# Patient Record
Sex: Female | Born: 1985
Health system: Western US, Academic
[De-identification: ages and names within clinical notes are randomized; demographics above are authoritative.]

## PROBLEM LIST (undated history)

## (undated) ENCOUNTER — Inpatient Hospital Stay (HOSPITAL_COMMUNITY): Admission: AD | Payer: Self-pay

## (undated) DIAGNOSIS — Z9889 Other specified postprocedural states: Secondary | ICD-10-CM

## (undated) DIAGNOSIS — B977 Papillomavirus as the cause of diseases classified elsewhere: Secondary | ICD-10-CM

## (undated) DIAGNOSIS — J302 Other seasonal allergic rhinitis: Secondary | ICD-10-CM

## (undated) DIAGNOSIS — Z8619 Personal history of other infectious and parasitic diseases: Secondary | ICD-10-CM

## (undated) DIAGNOSIS — L509 Urticaria, unspecified: Secondary | ICD-10-CM

## (undated) HISTORY — PX: NOSE SURGERY: SHX723

## (undated) HISTORY — DX: Urticaria, unspecified: L50.9

## (undated) HISTORY — DX: Papillomavirus as the cause of diseases classified elsewhere: B97.7

## (undated) HISTORY — DX: Personal history of other infectious and parasitic diseases: Z86.19

## (undated) HISTORY — DX: Other seasonal allergic rhinitis: J30.2

## (undated) HISTORY — DX: Other specified postprocedural states: Z98.890

---

## 2013-02-06 HISTORY — PX: OTHER PROCEDURE: U1053

## 2014-11-07 DIAGNOSIS — B977 Papillomavirus as the cause of diseases classified elsewhere: Secondary | ICD-10-CM

## 2014-11-07 HISTORY — DX: Papillomavirus as the cause of diseases classified elsewhere: B97.7

## 2015-07-01 ENCOUNTER — Ambulatory Visit
Admission: AD | Admit: 2015-07-01 | Discharge: 2015-07-01 | Disposition: A | Discharge status: Home / Self Care | Payer: Medicaid Other | Attending: Obstetrics & Gynecology | Admitting: Obstetrics & Gynecology

## 2015-07-01 ENCOUNTER — Encounter (HOSPITAL_BASED_OUTPATIENT_CLINIC_OR_DEPARTMENT_OTHER): Payer: Self-pay | Admitting: Gynecologic Oncology

## 2015-07-01 ENCOUNTER — Telehealth (HOSPITAL_BASED_OUTPATIENT_CLINIC_OR_DEPARTMENT_OTHER): Payer: Self-pay

## 2015-07-01 DIAGNOSIS — R109 Unspecified abdominal pain: Secondary | ICD-10-CM | POA: Insufficient documentation

## 2015-07-01 DIAGNOSIS — R197 Diarrhea, unspecified: Secondary | ICD-10-CM

## 2015-07-01 DIAGNOSIS — O26899 Other specified pregnancy related conditions, unspecified trimester: Principal | ICD-10-CM | POA: Insufficient documentation

## 2015-07-01 DIAGNOSIS — Z789 Other specified health status: Secondary | ICD-10-CM | POA: Insufficient documentation

## 2015-07-01 LAB — URINALYSIS WITH CULTURE REFLEX, WHEN INDICATED
Bilirubin: NEGATIVE
Blood: NEGATIVE
Glucose: NEGATIVE
Leuk Esterase: NEGATIVE
Nitrite: NEGATIVE
Protein: NEGATIVE
Specific Gravity: 1.017 (ref 1.002–1.030)
Urobilinogen: NEGATIVE
pH: 6 (ref 5.0–8.0)

## 2015-07-01 NOTE — Interdisciplinary (Signed)
Dr. Alphonzo Lemmingshebert at bedside, reviewed plan of care for patient to make appt for prenatal care.  MD to f/u on urinalysis culture.  All questions answered.

## 2015-07-01 NOTE — Interdisciplinary (Signed)
Lauren Booth is a 30 year old No obstetric history on file. at Unknown presenting to labor & delivery complaining of diarrhea since yesterday. Patient was seen at [facility] today and was told by her MD to come to [place] to be evaluated. Will obtain patients medical records from clinic.     Medical / OB History:   There is no problem list on file for this patient.      Chief Compliant:  No chief complaint on file.      Uterine Contractions: None    Rupture of Membrane: no    Vaginal Bleeding: no    Fetal Movement:Active    Previous C/S: no    Pre-eclampsia symptoms: no    Urinary tract infection symptoms: no      External fetal monitor and toco applied. VS obtained. Side rails up x2. Call light in reach. Patient awaiting evaluation by provider.    Dellis FilbertJolynn Marica Trentham, RN, Charity fundraiserN.

## 2015-07-01 NOTE — Telephone Encounter (Addendum)
Sofia Calling from [facility] to check if medical records were received? She is requesting to please call Pt to book New OB visit and also states Pt is pregnant and has history of cone Bx. And has a small cervix, and needs a cervical cerclage.    Best number to be reached at [phone] .Ok to leave a confidential detailed message. Please advise

## 2015-07-01 NOTE — Discharge Instructions (Signed)
Obstetrical Outpatient Discharge Instructions:    Labor & Delivery's Phone #: [phone]                             Based on the medical evaluation completed by our staff, it has been determined that you are NOT IN NEED OF EMERGENCY OBSTETRICAL SERVICES AT THIS TIME.     If and when any of the symptoms noted below occur, you are advised to go to the hospital closest to your home that provides obstetrical services or to the hospital that you and your health care provider have agreed upon.    Please follow the instructions for:    PRETERM LABOR PRECAUTIONS    1. Regular uterine tightening  2. Low, dull backache.  3. Menstrual cramps  4. Pressure in your pelvis  5. Leaking or gushing fluid from your vagina.  6. Changes in vaginal discharge; watery, bloody or mucousy.          PREGNANCY INDUCED HYPERTENSION    1. Headache: sudden or severe.  2. Visual problems: blurred or double vision or "seeing spots"  3. Pain under your ribs, right over your stomach  4. Swelling of your face and hands  5. Swelling of your ankles or feet after 12-hour rest  6. Decrease in the amount of your urine  7. Rapid weight gain: 4 or 5 pounds in one week.

## 2015-07-01 NOTE — Progress Notes (Signed)
ATTENDING OB TRIAGE NOTE:    Subjective    Chief complaint:  Diarrhea and cramps    History of present illness:  Briefly, this a 30 year old G1P0 at 6875w0d admitted to labor and delivery for evaluation. Current presenting symptoms:  Ate bad meal in [place] and now with diarrhea; no fever; no OB symptoms    Prenatal issues: late to care here     See resident triage note for further details of the patient's history.    Objective    I have examined the patient and concur with the resident exam.  See the resident and physical for further details.      Assessment:  30 year old G1P0 at 7075w0d admitted to observation for assessment of GI symptoms; benign abdominal exam; UA pending; no evidence of sepsis or dehydration; normal TVCL and no evidence of preterm labor; reassuring fetal status. Biometry c/w dates. Suspect viral GI syndrome vs food poisoning.    Plan:  Home with precautions and contact given to establish care here; call if fever, worsened symptoms or no resolution in 24-48 hours    I agree with the resident care plan.      Glean SalenStephen Tymel Conely MD

## 2015-07-01 NOTE — Progress Notes (Signed)
Labor and Delivery Triage Assessment    History of Present Illness:  Lauren Booth [id number] is a 30 year old G1P0 at [redacted]w[redacted]d EGA c/w 2nd trimester u/s who presents for a chief complaint of diarrhea.    Symptom(s) location:  Patient reports getting ill last week after eating "bad meat" in [place]. She reports anorexia, nausea w/o vomiting, diarrhea for 4 days (up to 6x episodes daily). Her diarrhea was watery, without any blood or mucus. Nothing has made her symptoms better or worse. Her diarrhea stopped 1 week ago. She has not taking any medications during this illness.    Further, reports decreased energy and diffuse aches, improving since last week.  Denies fever, chills, shortness or breath, chest pain. Endorses abdominal cramping, mild in severity. Has noticed few teaspoons of whitish vaginal discharge in toilet for past 3 days.    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 Marital status: N/A     Spouse name: N/A   . Number of children: N/A   . Years of education: N/A     Social History Main Topics   . Smoking status: Not on file   . Smokeless tobacco: Not on file   . Alcohol use Not on file   . Drug use: Not on file   . Sexual activity: Not on file     Social Activities of Daily Living Present   . Not on file     Social History Narrative   . No narrative on file   - No smoking, EtOH    Family History:  No family history on file.    Allergies:  Allergies not on file    Review of Systems:   A comprehensive review of systems was negative except for as mentioned  above.    Medications:  - Cetirizine for allergies   Started on these yesterday  - Nasal spray for allergies   Started on these yesterday  - PNV, Calcium, Vit E    Vitals:   07/01/15  1217   BP: 105/61   Pulse: 83   Temp: 98 F (36.7 C)   General appearance: in no apparent distress and well developed and well nourished  Heart: regular rate and rhythm  Lungs:clear to auscultation  Abdomen: Abdomen soft, non-tender. BS normal. No masses, organomegaly  Extremities: No cyanosis, clubbing and No edema  Neuro: no clonus, 2 + DTRs  Pelvic: deferred    FHR: baseline 140's, moderate variability,+ accelerations, no decelerations  Uterine Activity: minimal    TVCL: 3.74, 3.62, 3.45; 3.71 w/ valsalva  Abdominal US: fetus breech, placenta anterior, AFI 7.97 cm    Biophysical Profile:  EUA [redacted]w[redacted]d  BPD [redacted]w[redacted]d 5.22 cm  HC [redacted]w[redacted]d 20.5 cm  AC [redacted]w[redacted]d 17.6 cm  FL [redacted]w[redacted]d 4.03 cm  527 grams    Labs: none    Assessment/Plan:  Lauren Booth is a 30 year old who presents for diarrhea.  1. Diarrhea  likely viral in etiology, afebrile, mild in description, improving with time.   - maintain adequate hydration and PO intake (including probiotic), counseled to call if symptoms do not resolve within 24 hours or if experiences fevers/ chills, severe abdominal pain.    2. Dysuria r/o UTI  - will send UA w/ reflex [place]  - patient is stable for discharge, will f/u urinalysis and call patient if positive.   2. Fetal Well-being: reactive and reassuring, adequate AFI  4. Dispo: home with return precautions. Counseled specifically to call for fevers, chills, inability to tolerate PO or if diarrhea does not resolve in 24 hours.    Discharge instructions and precautions reviewed with patient, will call patient with urinalysis results.     Plan of care discussed with attending, Dr. Alphonzo LemmingsHebert.    07/01/2015   12:51 PM     Tamala Barionway Xu    Agree with patient care as written above.     Retta DionesLaura Brooklyne Radke, [phone] (PGY-4)

## 2015-07-02 ENCOUNTER — Encounter (HOSPITAL_BASED_OUTPATIENT_CLINIC_OR_DEPARTMENT_OTHER): Payer: Self-pay | Admitting: Family Medicine

## 2015-07-02 DIAGNOSIS — Z3492 Encounter for supervision of normal pregnancy, unspecified, second trimester: Principal | ICD-10-CM

## 2015-07-02 NOTE — Telephone Encounter (Signed)
Pt calling to schedule appt. Pt was advised records are to be reviewed prior to scheduling. Pt's registration has been completed and telephone number has been verified. Pt can be reached at [phone]. Please advise

## 2015-07-06 ENCOUNTER — Telehealth (HOSPITAL_BASED_OUTPATIENT_CLINIC_OR_DEPARTMENT_OTHER): Payer: Self-pay

## 2015-07-06 NOTE — Telephone Encounter (Signed)
We have not received any records for this patient.

## 2015-07-06 NOTE — Telephone Encounter (Signed)
Sofia from [facility] calling to schedule, states pt is hight risk due to shorten cervix and needs cerclage. Keenan BachelorSofia is aware no records have been received and will re fax (to [phone]). Best number if no records are received is [phone]. Pt was also seen at ED on 5/25. Please advise, thank you

## 2015-07-07 ENCOUNTER — Telehealth (HOSPITAL_BASED_OUTPATIENT_CLINIC_OR_DEPARTMENT_OTHER): Payer: Self-pay

## 2015-07-07 NOTE — Telephone Encounter (Signed)
Spoke with [facility] at [facility] regarding referral and records.  Keenan BachelorSofia states that she received our L&D records and understands that the pt had a normal cervical exam and her provider may not want to transfer care.  She will discuss with provider and [facility] to [facility] if [facility] still desired.

## 2015-07-07 NOTE — Telephone Encounter (Signed)
[facility]. Placed in review folder.

## 2015-07-12 ENCOUNTER — Ambulatory Visit
Admission: RE | Admit: 2015-07-12 | Discharge: 2015-07-12 | Disposition: A | Discharge status: Home / Self Care | Payer: Medicaid Other | Attending: Diagnostic Radiology | Admitting: Diagnostic Radiology

## 2015-07-12 DIAGNOSIS — Z36 Encounter for antenatal screening of mother: Principal | ICD-10-CM | POA: Insufficient documentation

## 2015-07-12 DIAGNOSIS — Z3492 Encounter for supervision of normal pregnancy, unspecified, second trimester: Secondary | ICD-10-CM

## 2015-07-12 DIAGNOSIS — Z3A24 24 weeks gestation of pregnancy: Secondary | ICD-10-CM | POA: Insufficient documentation

## 2015-07-13 ENCOUNTER — Encounter (HOSPITAL_BASED_OUTPATIENT_CLINIC_OR_DEPARTMENT_OTHER): Payer: Self-pay

## 2015-07-13 ENCOUNTER — Encounter (HOSPITAL_COMMUNITY): Payer: Self-pay | Admitting: Obstetrics & Gynecology

## 2015-07-13 DIAGNOSIS — Z09 Encounter for follow-up examination after completed treatment for conditions other than malignant neoplasm: Secondary | ICD-10-CM

## 2015-07-13 NOTE — Telephone Encounter (Signed)
Patient records received today via fax and are being reviewed for CNM care eligibility.  Will contact the patient in 2-3 business days with eligibility status. Patient has been informed.

## 2015-07-13 NOTE — Telephone Encounter (Signed)
Rcvd call from [place], verifying if pt had been scheduled. Advised pt was waiting for CNM reviewal process and upon approval.

## 2015-07-14 NOTE — Telephone Encounter (Addendum)
Patient records reviewed and approved by Marguerita MerlesStephanie Jacobsen, CNM.  Okay to make NOB appt with CNM division if insurance/authorization has been approved. [facility] eligibility not confirmed / [facility], meet to get copies of all lab results and anatomy scan, attempt to call patient no asnwer.

## 2015-07-16 ENCOUNTER — Ambulatory Visit: Payer: Medicaid Other

## 2015-07-16 ENCOUNTER — Ambulatory Visit (HOSPITAL_BASED_OUTPATIENT_CLINIC_OR_DEPARTMENT_OTHER): Payer: Medicaid Other

## 2015-07-16 ENCOUNTER — Ambulatory Visit (HOSPITAL_BASED_OUTPATIENT_CLINIC_OR_DEPARTMENT_OTHER): Payer: Medicaid Other | Admitting: Advanced Practice Midwife

## 2015-07-16 ENCOUNTER — Other Ambulatory Visit: Payer: Self-pay

## 2015-07-16 ENCOUNTER — Other Ambulatory Visit: Payer: Medicaid Other | Attending: Advanced Practice Midwife

## 2015-07-16 ENCOUNTER — Encounter (HOSPITAL_BASED_OUTPATIENT_CLINIC_OR_DEPARTMENT_OTHER): Payer: Self-pay | Admitting: Advanced Practice Midwife

## 2015-07-16 ENCOUNTER — Encounter (HOSPITAL_BASED_OUTPATIENT_CLINIC_OR_DEPARTMENT_OTHER): Payer: Self-pay

## 2015-07-16 VITALS — BP 95/63 | HR 77 | Temp 99.9°F | Ht 65.0 in | Wt 143.5 lb

## 2015-07-16 DIAGNOSIS — Z3401 Encounter for supervision of normal first pregnancy, first trimester: Secondary | ICD-10-CM

## 2015-07-16 DIAGNOSIS — K649 Unspecified hemorrhoids: Principal | ICD-10-CM

## 2015-07-16 DIAGNOSIS — Z3402 Encounter for supervision of normal first pregnancy, second trimester: Secondary | ICD-10-CM

## 2015-07-16 DIAGNOSIS — Z9882 Breast implant status: Secondary | ICD-10-CM

## 2015-07-16 DIAGNOSIS — Z630 Problems in relationship with spouse or partner: Secondary | ICD-10-CM

## 2015-07-16 DIAGNOSIS — O09892 Supervision of other high risk pregnancies, second trimester: Secondary | ICD-10-CM

## 2015-07-16 DIAGNOSIS — Z3A23 23 weeks gestation of pregnancy: Secondary | ICD-10-CM

## 2015-07-16 LAB — URINALYSIS
Bilirubin: NEGATIVE
Blood: NEGATIVE
Glucose: NEGATIVE
Ketones: NEGATIVE
Leuk Esterase: NEGATIVE
Nitrite: NEGATIVE
Protein: NEGATIVE
Specific Gravity: 1.012 (ref 1.002–1.030)
Urobilinogen: NEGATIVE
pH: 7 (ref 5.0–8.0)

## 2015-07-16 LAB — GLUCOSE, BLOOD: Glucose: 74 mg/dL (ref 70–99)

## 2015-07-16 LAB — GTT 2HR, GESTATIONAL
GTT 1 hr, Gest: 120 mg/dL (ref 70–180)
GTT 2 hr, Gest: 123 mg/dL (ref 70–153)
GTT Fast, Gest: 67 mg/dL — ABNORMAL LOW (ref 70–92)

## 2015-07-16 LAB — HEPATITIS B SURFACE AG, BLOOD: HBsAg: NONREACTIVE

## 2015-07-16 LAB — GLYCOSYLATED HGB(A1C), BLOOD: Glyco Hgb (A1C): 4.4 % — ABNORMAL LOW (ref 4.8–5.8)

## 2015-07-16 LAB — ABO/RH: ABO/RH: O POS

## 2015-07-16 LAB — ANTIBODY SCREEN (INDIRECT COOMBS): Antibody Screen: NEGATIVE

## 2015-07-16 MED ORDER — PRENATAL 1 PO: ORAL | Status: AC

## 2015-07-16 MED ORDER — HYDROCORTISONE 1 % EX CREA
1.0000 | TOPICAL_CREAM | Freq: Two times a day (BID) | CUTANEOUS | 1 refills | Status: DC
Start: 2015-07-16 — End: 2015-09-24

## 2015-07-16 MED ORDER — LORATADINE 10 MG OR TABS: 10.00 mg | ORAL_TABLET | Freq: Every day | ORAL | Status: AC

## 2015-07-16 MED FILL — HYDROCORTISONE CREAM 1%: % | 20 days supply | Qty: 28 | Fill #0

## 2015-07-16 NOTE — Patient Instructions (Signed)
Initial Prenatal Visit    Welcome to [facility]!      This is the first of several messages that will be a part of your After Visit Summary (AVS). We're so happy you've chosen us to guide you through your pregnancy.           How do I reach my provider?    Have you signed up for "MyChart"?  It is the best way to communicate with us for your non-urgent needs.     You will need an activation code, your medical record number, and your date of birth.  If you need a code please ask us or any of our front desk staff or call and we can provide one over the phone! You can also sign up for MyChart on the  Health website. Our goal is to answer your messages within 2-3 business days.      Our clinic phone numbers are:   [facility] - [phone]   [facility] - [phone]   [facility] - [phone]      The phone lines operate from 8:00am-5:30pm.  If you have an URGENT need, please call the hospital operator at [phone] and identify that this is an URGENT need.  After you are [redacted] weeks pregnant, you can also speak to a nurse or a physician on [facility] and Delivery by calling [phone] or [facility] at [phone].    If you are one of our midwife patients with an URGENT need, you can reach the midwife on call after hours at [phone].          Weeks, Not Months    By the way, we talk about WEEKS in pregnancy, not MONTHS.  The average pregnancy lasts about 40 weeks (from your last menstrual period).  We label the week once the week is completed..thus, you will be "24 weeks" when you finish your 24th week.    Here are some pregnancy milestones and the "weeks gestation" when we usually see them:  * Week 1- begins with the first day of your last period  * Week 2- meeting of sperm and egg  * Week 4-5- a missed period; maybe the beginning of some nausea and breast changes  * Week 5- able to see a black space (the gestational sac) on ultrasound  * Week 6- able to  see a yolk sac in the gestational sac  * Week 6-7- able to see cardiac motion (a beating heart) on ultrasound  * Week 8-9- able to make out arm and legs buds on the embryo  * Week 12- sometimes can hear the heart beat with a Doppler device  * Week 14-16- nausea and fatigue begin to improve  * Week 16-18- the earliest that experienced mothers first feel their baby's movements   * Week 18-20- the earliest that first-time mothers first feel their baby's movements  * Week 24- the beginning of the 10-week "prematurity" period  * Week 34- still too early to be born, but baby is now rapidly maturing  * Week 38- the beginning of "term"  * Week 40- the average gestational age for a human baby at birth  * Week 41- at the beginning of this week, you are one week "overdue"       What should I do if I am having issues with nausea or vomiting?    Can't eat because of nausea and vomiting?    Don't worry . . . at this point babies are very effective at getting what they need . . . they can get their nutrients directly from your bloodstream.  But what about you if you are unable to eat or vomiting?  Call us if you're unable to keep down fluids for more than 8 hours . . . you may need our help to get you through this.  If you're keeping fluids down, but have no appetite because of nausea here are some things you might try before calling us:    *  First, eat before you feel hungry.  Try to keep something in your stomach at all times.   This may help keep your blood sugar from bottoming out.  Eat small, frequent meals that include some protein.  Get a healthy cookie or cracker (soda crackers or ginger snap cookies as a suggestion), put it next to your bed and eat one or two before you get up in the morning . . . then go eat a breakfast that includes some protein.     *  Drink something before you eat something.  Small amounts of these drinks are often helpful: carbonated drinks, sports drinks, lemonade, ginger ale, and mint tea.    *   Eat what you can, when you can.  If you're really nauseated, and can only stomach certain foods, eat what stays down . . . you can catch up on balanced nutrition later when you feel better (usually by 14-16 weeks).      *  Try some ginger root . . . as a tea, in ginger capsules, pickled ginger, in ginger snap cookies, or as ginger candy.  Make sure that your "ginger ale" actually has real ginger in it.  Here's a recipe for a ginger tea that's tasty, easy to make and easy to carry with you in a thermos bottle:    Buy a ginger root at the store.  Peel off the brown stuff and use a knife or carrot peeler to cut off dime/nickel-sized slices.  Throw a small handful of these into a pint of piping-hot water and let it steep (after 10 minutes you can strain off the ginger but there's no need to remove the ginger if you don't want to).  Sweeten it with a little honey.  Squeeze half a lemon into it . . . start sipping it and take a sip every 15-30 minutes.    *  Get a Seaband or Bioband . . . an elastic bracelet that utilizes acupressure theory from Traditional Chinese Medicine .  These bracelets have an acupressure button that compresses the "Nei-Kuan", or "P6", acupressure spot.      *  Other hints:    -brush your teeth    -go outside and breathe fresh air; avoid stuffy rooms    -don't take vitamins or supplements on an empty stomach    -avoid odors that trigger your nausea    *  Still nauseated?  Try Vitamin B6 and Unisom.  Back in 1960s and 70s there was a drug approved by the Food and Drug Administration for use in pregnancy for nausea and vomiting.  The drug was a mix of B6 and the active ingredient in Unisom (doxylamine).  It's safe and quite effective.  Here's what to do:  buy Unisom tablets, not the gel caps.  In the morning, break one in half and take half a Unisom with 50mg of Vitamin B6.  Before   bed, take a full Unisom tablet and 50mg of Vitamin B6.  You'll probably notice that you get quite sleepy after taking it  . . . keep at it . . . in four or five days the drowsiness will get better, and so will your nausea.    *  None of this is working?  Call us.  We have some prescription medicines that may help.    CONTACT INFORMATION  Please call the clinic M-F during business hours for questions or concerns    [facility]   [phone]  [facility]  [phone]  [facility]  [phone]    For after hours urgent questions or labor concerns      For MD patients delivering at [facility]   Call Labor and Delivery at   [phone]  OR  Page the Doctor on call for [facility]  [phone]    For midwife patients delivering at [facility]  Call [phone]    For MD or CNM patients delivering at [facility]  Call Labor and Delivery at  [phone]  OR  Call [phone] for the [facility] Doctor on call

## 2015-07-16 NOTE — Progress Notes (Signed)
Patient here for orientation and enrollment into prenatal care @[redacted]  weeks gestation, transferring care and desires CNM care and [facility]. Oriented patient into routine prenatal care thru CNM and guidi lines to sty under CNM care. CPSP assessment done, review support system available thru CPSP, patient currently separated from husband and would like to have mental health referral EDS=18. Review normal discomforts and warning signs during this trimester and emergency telephone numbers given and advised when to seek medical attention. Discuss importance of a balance diet and importance of taking prenatal daily and importance of H20 intake. Appointment with CNM today for initial OB appointment, review test and exam to be done today.    Lauren Booth, CPHW  90 minutes

## 2015-07-16 NOTE — Progress Notes (Signed)
New OB visit     Subjective:  Chistina Booth is being seen today for her obstetrical visit. She is at [redacted]w[redacted]d.  Patient reports feeling emotional after husband kicked her out of their home.  She expects he is cheating on her.  She is now living with her supportive mom who is here with her.  FOB is aware of pregnancy but has blocked her calls.               Dating Criteria:  Patient's last menstrual period was 01/21/2015.    Genetic History: Reviewed flowsheet in EPIC. Pertinent positives .    Past Medical History:   Diagnosis Date   . History of chicken pox    . History of cone biopsy of cervix     done in [place]   . 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 Seasonal allergies        Past Surgical History:   Procedure Laterality Date   . breast implants  Jun 12, 2013    Breast Augmentation in [place]   . NOSE SURGERY      due to broken nose       Social History     Social History   . Marital status: Married     Spouse name: Lauren Booth (28) In [place]   . Number of children: 0   . Years of education: 19     Occupational History   . Homemaker      Social History Main Topics   . Smoking status: Never Smoker   . Smokeless tobacco: Never Used   . Alcohol use No   . Drug use: No   . Sexual activity: Yes     Partners: Male     Other Topics Concern   . Seat Belt Use Yes     Social History Narrative    Together with FOB x 4 yrs, married in Nov, planned pregnancy    Now separated x 1 mos.  Pt feels partner unfaithful.  FOB kicked her out of house.     Now pt lives with her mother./ supportive.  Also has friends in [place]    FOB aware of pregnancy, but not supportive, not in touch.    Pt with increased stress.         Family History   Problem Relation Age of Onset   . Other Father      bell's palsy   . Hypertension Maternal Grandmother    . Stroke Maternal Grandfather    . Other Paternal Grandmother 1     Alzahimers   . Heart Attack Paternal Grandfather 60     died in 06-12-88                        No Known Allergies    Medications: Prep H for  hemorrhoids given today.     EDS:   19/ denies SI or HI/  R/t marital stress    PHYSICAL EXAMINATION:    Ht  (1.651 m)  Wt 65.1 kg (143 lb 8.3 oz)  LMP 01/21/2015  BMI 23.88 kg/m2 Body mass index is 23.88 kg/(m^2).     General Appearance: in no apparent distress and well developed and well nourished  Psych: teary   Prior PE done 07/01/15 WNL  Breast:  implants yes  Abdomen:  neg  Extremities, Peripheral Vascular: Normal  Pelvic Exam: bimanual only  Vulva: Normal  Urethra: normal urethral meatus  Bladder: normal  Vagina: Normal mucosa, no discharge   Cervix: Nulliparous, closed, mobile,  no discharge and No cervical motion tenderness  Uterus: normal sized for GA  Adnexa: non-palpable  Anus/Perineum: one pea sized hemorrhoid, soft    Assessment:  Pregnancy at 4469w4d   Dated by: 12 wk sono c/w [place] 23 wk sono      Plan:   Initial prenatal instructions were given.   Pt seen by Lafonda Mossesiana.  The contents of the OB packet were reviewed with the patient in detail and all questions were answered   Counseling:  - avoid alcohol, tobacco products and recreational drugs    - avoid contact with cat fecal material, raw or undercooked foods, unwashed produce, high mercury content seafood and unpasteurized foods.   - take vitamins that contain 0.4 mg of Folic Acid   Diet, exercise and travel restrictions were reviewed   Zika precautions reviewed and CDC pamphlet given  Prenatal screening, risks and limitations were reviewed, scheduling instructions reviewed (see AVS)    Prenatal labs reviewed and ordered.  Pt is fasting and desires to try and do 2 hr  GTT today  Pap:  done 10/12/14, will request records  Problem List Updated   Oriented to practice and emergency contact numbers provided. Encouraged MTM/Tour   All questions were answered   Follow-up visit with CNM  in 4 weeks    Kamarri Fischetti K Sonyia Muro, CNM     The visit  was approximately 60 minutes of which >50% was spent on counseling the patient on the assessment and treatment options.

## 2015-07-18 LAB — URINE CULTURE

## 2015-07-19 ENCOUNTER — Telehealth (HOSPITAL_BASED_OUTPATIENT_CLINIC_OR_DEPARTMENT_OTHER): Payer: Self-pay | Admitting: Advanced Practice Midwife

## 2015-07-19 ENCOUNTER — Encounter (HOSPITAL_BASED_OUTPATIENT_CLINIC_OR_DEPARTMENT_OTHER): Payer: Self-pay | Admitting: Advanced Practice Midwife

## 2015-07-19 DIAGNOSIS — A749 Chlamydial infection, unspecified: Secondary | ICD-10-CM

## 2015-07-19 LAB — HIV 1/2 ANTIBODY & P24 ANTIGEN ASSAY, BLOOD: HIV 1/2 Antibody & P24 Antigen Assay: NONREACTIVE

## 2015-07-19 LAB — SYPHILIS EIA SCREEN, BLOOD: Syphilis EIA Screen: NEGATIVE

## 2015-07-19 LAB — VARICELLA IGG ANTIBODY (ELISA), BLOOD: Varicella Zoster IgG Ab: POSITIVE mL

## 2015-07-19 LAB — CHLAMYDIA/GONORRHEA PCR, URINE
Chlamydia trachomatis PCR, Urine: DETECTED — AB
Neisseria gonorrhoeae PCR, Urine: NOT DETECTED

## 2015-07-19 MED ORDER — AZITHROMYCIN 1 GM OR PACK
1.0000 | PACK | Freq: Once | ORAL | 0 refills | Status: AC
Start: 2015-07-19 — End: 2015-07-19

## 2015-07-19 NOTE — Telephone Encounter (Signed)
30 yo G2P0 @ 24+0 wks.  Pt notified via telephone that CT is POS and RX sent to Pharmacy.  Edu done.  Pt v/u.  Rev'd partner treatment (she is divorcing from her husband/ FOB).  Pt aware TOC will be done nv.  Roger KillBeth Tarek Cravens, CNM [phone]

## 2015-07-20 ENCOUNTER — Encounter (HOSPITAL_BASED_OUTPATIENT_CLINIC_OR_DEPARTMENT_OTHER): Payer: Self-pay | Admitting: Advanced Practice Midwife

## 2015-07-20 DIAGNOSIS — O9989 Other specified diseases and conditions complicating pregnancy, childbirth and the puerperium: Secondary | ICD-10-CM

## 2015-07-20 DIAGNOSIS — Z283 Underimmunization status: Secondary | ICD-10-CM

## 2015-07-20 LAB — QUANTIFERON-TB, BLOOD
Quantiferon TB: NEGATIVE
TB Antigen - Nil: -0.009 [IU]/mL

## 2015-07-20 LAB — RUBELLA IGG ANTIBODY, BLOOD: Rubella IGG Ab (EIA): NEGATIVE — AB

## 2015-07-21 LAB — EMMI , PAIN RELIEF FOR CHILDBIRTH: EMMI Video Order Number: 12893952155

## 2015-07-26 LAB — CYSTIC FIBROSIS, 39 MUTATIONS, BLOOD

## 2015-07-28 ENCOUNTER — Telehealth (HOSPITAL_BASED_OUTPATIENT_CLINIC_OR_DEPARTMENT_OTHER): Payer: Self-pay | Admitting: Advanced Practice Midwife

## 2015-07-28 ENCOUNTER — Telehealth (HOSPITAL_BASED_OUTPATIENT_CLINIC_OR_DEPARTMENT_OTHER): Payer: Self-pay

## 2015-07-28 NOTE — Telephone Encounter (Signed)
Returned call to patient. States she is 25+2 weeks and since yesterday has noticed pain on her lower abdomen and tightening of her belly. Denies bleeding , loss of fluids, fever, vaginal discharge or dysuria. States she is feeling a lot of fetal movement. Discomfort mostly on right side. Advised to patient that I will send web message to midwife on call. Verbalized understanding.

## 2015-07-28 NOTE — Telephone Encounter (Signed)
Returned TC to Pt [phone].  EGA 6683w2d.  No Answer.  Message left for Pt to call on Call CNM again or to L&D for Ctxs/abdominal pain not relieved with rest and hydration as reviewed.  Candy SledgeLinda R. Excell Seltzerooper CNM  [phone]

## 2015-07-28 NOTE — Telephone Encounter (Signed)
Pt is currently [redacted] weeks pregnant and since yesterday pt has been feeling sharp pain in lower abdomen area.  Pt states its mostly on the right side and it shoots all the way down   Sometimes stomach feels hard   Pt requesting a call back to further discuss  No bleeding   No discharge     Please advise thankyou

## 2015-07-30 ENCOUNTER — Ambulatory Visit (HOSPITAL_BASED_OUTPATIENT_CLINIC_OR_DEPARTMENT_OTHER): Payer: Medicaid Other | Admitting: Advanced Practice Midwife

## 2015-08-06 ENCOUNTER — Telehealth (INDEPENDENT_AMBULATORY_CARE_PROVIDER_SITE_OTHER): Payer: Self-pay | Admitting: Marriage & Family Therapist

## 2015-08-06 NOTE — Telephone Encounter (Signed)
New patient screening form for Women's Mental Health has been uploaded to Media. Please review and advise on scheduling.

## 2015-08-13 ENCOUNTER — Encounter (HOSPITAL_BASED_OUTPATIENT_CLINIC_OR_DEPARTMENT_OTHER): Payer: Self-pay | Admitting: Women's Health

## 2015-08-13 ENCOUNTER — Other Ambulatory Visit: Payer: Medicaid Other | Attending: Women's Health

## 2015-08-13 ENCOUNTER — Ambulatory Visit: Payer: Medicaid Other | Attending: Women's Health | Admitting: Women's Health

## 2015-08-13 VITALS — BP 105/67 | HR 101 | Temp 98.0°F | Ht 65.0 in | Wt 150.4 lb

## 2015-08-13 DIAGNOSIS — Z3402 Encounter for supervision of normal first pregnancy, second trimester: Secondary | ICD-10-CM

## 2015-08-13 DIAGNOSIS — Z9882 Breast implant status: Secondary | ICD-10-CM

## 2015-08-13 DIAGNOSIS — Z630 Problems in relationship with spouse or partner: Secondary | ICD-10-CM | POA: Insufficient documentation

## 2015-08-13 DIAGNOSIS — Z3493 Encounter for supervision of normal pregnancy, unspecified, third trimester: Secondary | ICD-10-CM | POA: Insufficient documentation

## 2015-08-13 DIAGNOSIS — Z283 Underimmunization status: Secondary | ICD-10-CM

## 2015-08-13 DIAGNOSIS — O98812 Other maternal infectious and parasitic diseases complicating pregnancy, second trimester: Secondary | ICD-10-CM

## 2015-08-13 DIAGNOSIS — A749 Chlamydial infection, unspecified: Secondary | ICD-10-CM

## 2015-08-13 DIAGNOSIS — O9989 Other specified diseases and conditions complicating pregnancy, childbirth and the puerperium: Secondary | ICD-10-CM

## 2015-08-13 DIAGNOSIS — Z2839 Other underimmunization status: Secondary | ICD-10-CM

## 2015-08-13 LAB — HIV 1/2 ANTIBODY & P24 ANTIGEN ASSAY, BLOOD: HIV 1/2 Antibody & P24 Antigen Assay: NONREACTIVE

## 2015-08-13 NOTE — Progress Notes (Signed)
08/13/2015 12:54 PM    Lauren Booth 30 year old female [id number]  G2P0010 3685w4d    S: Feeling well overall, reports. Denies leaking of fluids, vaginal bleeding, [place]'s, headaches, blurry vision, or decreased fetal movement. Reviewed history of cone biopsy/cervical length on anatomy US 4.1 cms. [facility]. Considering Tdap-given information. Will consider PP BCM, peds as well. Here today with her mother, would like to be retested re: HIV/RPR due to husbands infidelity, agrees to leave sample for CT TOC. Given 3rd trimester booklet, reviewed kick counts. Many questions answered.    O: see flowsheet  BP 105/67 (BP cuff site: Right;Upper;Arm, BP Patient Position: Sitting, BP cuff size: Regular)  Pulse 101  Temp 98 F (36.7 C) (Oral)  Ht 5\' 5"  (1.651 m)  Wt 68.2 kg (150 lb 5.7 oz)  LMP 01/21/2015  BMI 25.02 kg/m2 13.2 kg (29 lb 1.6 oz)    No results found for: WBC, RBC, HGB, HCT, MCV, MCHC, RDW, PLT, MPV    Patient Active Problem List   Diagnosis   . Encounter for supervision of normal first pregnancy in second trimester   . Stress due to marital problems   . Hx of breast implants, bilateral   . Chlamydia   . Rubella non-immune status, antepartum       A: 30 year old G2P0010 @ 5885w4d     P: See pregnancy checklist.   1. Kick counts/routine precautions   2. Reviewed CNM emergency # and when to call.  3. 3rd trimester milestones, daily FMR, preterm labor precautions  4. Tdap: considering  5. BCM: undecided  6: Labs as ordered  7. RTC: 3 week(s)/PRN     Lauren Booth, CNM

## 2015-08-13 NOTE — Patient Instructions (Signed)
Your pregnancy at 28 weeks    At 28 weeks, your baby is kicking and making grasping motions with its hands. You may feel your baby doing somersaults inside. The lungs are continuing to develop and produce surfactant.    If you are having painful cramping, any bleeding or are concerned you may be leaking fluid, please call us right away.    Breastfeeding    [facility] has been a designated Baby-Friendly site since 2006.      Please consider registering for our prenatal breastfeeding class for additional information.      Go to our website and register for classes:   [email or web address]      In the hospital, our lactation consultants will spend time helping you to make sure you and your baby get off to a great start.  You will also be supported and encouraged by our nurses and physician teams.    Breastfeeding is good for moms and babies.     Breast fed babies:    *Have fewer ear, intestinal and respiratory infections    *Are less likely to be obese    *Have a lower risk of diabetes    *Have a lower risk of sudden infant death syndrome (SIDS)     Moms who breast feed:    *Have a lower risk of breast and ovarian cancer    *Have fewer hip fractures later in life    *Have a lower risk of cardiovascular disease    Strategies for successful breastfeeding    1.  Place baby skin to skin right after birth.   *Good for mother-baby bonding   *Helps get breastfeeding off to a good start   *Keeps baby warm   *Keeps mother and baby calm  2.  Feed your baby on demand   *Feed your baby when baby appears hungry   *Keeps your baby content   *Prevents breast feeding complications such as mastitis   *Helps you develop a good milk supply   *Helps your baby get just the right amount to eat  3.  Babies room in with their mothers at [facility]   *All healthy babies stay in the same room as their moms   *Rooming-in helps parents learn baby's feeding cues   *Mom is  better able to feed her baby on demand   *Parents learn how to care for their baby   *Your baby learns to recognize you and significant family members  4.  Proper positioning and latch are important   *Ensures your baby gets enough milk   *Ensures you produce enough milk   *Prevents sore and cracked nipples  5.  Healthy breastfeeding babies need nothing besides breast milk   *Formula may make your baby less content with breast feeds   *Formula may make your baby more susceptible to illness   *Formula may reduce your breast milk supply    Our Educational Classes    Do you want to attend Childbirth Education classes?  We highly recommend it for our first-time mothers.  Not only do you receive some excellent education about pregnancy, labor, delivery, breastfeeding and baby care, but you'll meet other pregnant mothers... Maybe make a new friend..one with a baby the same age as yours.    Go to our website [email or web address] and read about your options for Childbirth Education.  Sign up for the classes there, too.     Not interested in our Childbirth Education class?  We   have shorter classes that focus specifically on either Parenting or Breastfeeding.    Your Baby's Movements     When you feel your baby's movement, it's a pretty good indication that the placenta is working well and your baby is getting all that is necessary to grow and thrive.    The lack of fetal movement is a less reliable indicator of poor health.  Most of the time when a mother does not feel her baby moving everything is still OK.  As your pregnancy continues your baby's sleep cycles last a little longer.    How much fetal movement is enough?  Good question...hard to answer.  One method that was actually studied is "Count to Ten".  Most babies are most active in the evening after dinner.  When your baby is active, write down the time on your fetal movement count card.  Count your baby's  movements, and write down the time once you have reached ten movements.  On average, it takes 10-15 minutes to feel 10 movements.  Some babies reach 10 movements faster, and some take a little longer.  It shouldn't take more than one hour to feel 10 movements.  If it takes longer than one hour to feel 10 movements, or if you are concerned that your baby's movements aren't normal, call Labor and Delivery.  Do your fetal movement count once per day, each day, until your baby is born.    CONTACT INFORMATION  Please call the clinic M-F during business hours for questions or concerns    [facility]   [phone]  [facility]  [phone]  [facility]  [phone]    For after hours urgent questions or labor concerns      For MD patients delivering at [facility]   Call Labor and Delivery at   [phone]  OR  Page the Doctor on call for [facility]  [phone]    For midwife patients delivering at  Medical Center  Call [phone]    For MD or CNM patients delivering at [facility]  Call Labor and Delivery at  [phone]  OR  Call [phone] for the [facility] on call

## 2015-08-16 LAB — SYPHILIS EIA SCREEN, BLOOD: Syphilis EIA Screen: NEGATIVE

## 2015-08-17 LAB — CHLAMYDIA/GONORRHEA PCR, URINE
Chlamydia trachomatis PCR, Urine: NOT DETECTED
Neisseria gonorrhoeae PCR, Urine: NOT DETECTED

## 2015-08-19 ENCOUNTER — Encounter (HOSPITAL_BASED_OUTPATIENT_CLINIC_OR_DEPARTMENT_OTHER): Payer: Self-pay | Admitting: Advanced Practice Midwife

## 2015-08-24 ENCOUNTER — Telehealth (INDEPENDENT_AMBULATORY_CARE_PROVIDER_SITE_OTHER): Payer: Self-pay | Admitting: Marriage & Family Therapist

## 2015-08-24 NOTE — Telephone Encounter (Signed)
Therapist called pt to f/u on counseling referral and pt desire to schedule an appt; not able to reach pt or leave a vm, please have pt call therapist directly at [phone]

## 2015-08-27 ENCOUNTER — Other Ambulatory Visit (HOSPITAL_BASED_OUTPATIENT_CLINIC_OR_DEPARTMENT_OTHER): Payer: Medicaid Other

## 2015-08-27 ENCOUNTER — Ambulatory Visit: Payer: Medicaid Other | Attending: Advanced Practice Midwife | Admitting: Advanced Practice Midwife

## 2015-08-27 VITALS — BP 104/58 | HR 89 | Temp 98.7°F | Ht 65.0 in | Wt 153.0 lb

## 2015-08-27 DIAGNOSIS — Z3402 Encounter for supervision of normal first pregnancy, second trimester: Secondary | ICD-10-CM | POA: Insufficient documentation

## 2015-08-27 DIAGNOSIS — Z3493 Encounter for supervision of normal pregnancy, unspecified, third trimester: Principal | ICD-10-CM

## 2015-08-27 DIAGNOSIS — A749 Chlamydial infection, unspecified: Secondary | ICD-10-CM | POA: Insufficient documentation

## 2015-08-27 DIAGNOSIS — Z3403 Encounter for supervision of normal first pregnancy, third trimester: Principal | ICD-10-CM | POA: Insufficient documentation

## 2015-08-27 LAB — RBC MORPHOLOGY
Plt Est: ADEQUATE
RBC Comment: NORMAL

## 2015-08-27 LAB — CBC WITH DIFF, BLOOD
ANC-Automated: 8.2 10*3/uL — ABNORMAL HIGH (ref 1.6–7.0)
Abs Eosinophils: 0.2 10*3/uL (ref 0.1–0.5)
Abs Lymphs: 2.9 10*3/uL (ref 0.8–3.1)
Abs Monos: 1.5 10*3/uL — ABNORMAL HIGH (ref 0.2–0.8)
Eosinophils: 1 %
Hct: 36.8 % (ref 34.0–45.0)
Hgb: 13 gm/dL (ref 11.2–15.7)
Imm Gran %: 1 % (ref <1)
Imm Gran Abs: 0.2 10*3/uL — ABNORMAL HIGH (ref <0.1)
Lymphocytes: 22 %
MCH: 31.1 pg (ref 26.0–32.0)
MCHC: 35.3 g/dL (ref 32.0–36.0)
MCV: 88 um3 (ref 79.0–95.0)
MPV: 11.7 fL (ref 9.4–12.4)
Monocytes: 12 %
Plt Count: 174 10*3/uL (ref 140–370)
RBC: 4.18 10*6/uL (ref 3.90–5.20)
RDW: 13.4 % (ref 12.0–14.0)
Segs: 63 %
WBC: 12.9 10*3/uL — ABNORMAL HIGH (ref 4.0–10.0)

## 2015-08-27 MED ORDER — ACETAMINOPHEN 500 MG OR TABS
500.0000 mg | ORAL_TABLET | Freq: Four times a day (QID) | ORAL | 0 refills | Status: DC | PRN
Start: 2015-08-27 — End: 2015-11-09

## 2015-08-27 NOTE — Progress Notes (Signed)
Pt here with her mother  Per Amber's request her direct # was given   C/o HA, has tried no meds, rec Tylenol.   Desires to try chiro for upper right back  [facility] form signed  Disc increased Vag DC with pregnancy.  Hemorrhoid relief rev'd.  TOC GC/ CT neg with neg STD testing  MRR for pap/ colpo.  States talking more with FOB but they are not seeing each other.  Pt has no complaints, positive fetal movement, denies contractions, bleeding, or leaking fluids. Exam consistent with dates.  Tomisha Reppucci K Lynde Ludwig, CNM  Lab did not draw CBC.  Pt to do.  Gwen PoundsXanthe calling to get genetic screen results.

## 2015-08-28 NOTE — Patient Instructions (Signed)
Your pregnancy at 30 weeks    At 30 weeks, your baby is putting on weight rapidly. Taste buds are developing and you may notice your baby beginning to hiccup.    Continue to do your fetal movement counts once per day each day.  If you have any concerns about your baby's movements, or if it is taking longer than one hour to get your movement counts, call us right away.    If you are having painful cramping, any bleeding or are concerned you may be leaking fluid, please call us right away.    Protecting your baby - the Tdap shot    The Advisory Committee on Immunization Practices (ACIP) of the Centers for Disease Control and Prevention is taking measures to fill a gap in the protection of infants from pertussis, by vaccinating pregnant women and people in contact with infants younger than 1 year.    The tetanus, diphtheria, and pertussis (Tdap) vaccine consists of tetanus toxoid, reduced diphtheria toxoid, and acellular pertussis vaccine. Vaccinating pregnant women and those in contact with infants will supplement the current practice of "cocooning," in which unvaccinated postpartum mothers and other family members receive Tdap. Although ACIP has recommended cocooning since 2005, compliance has been poor. ACIP also recommends Tdap for people aged 11 through 18 years after they complete the childhood vaccine regimen of diphtheria and tetanus toxoids and pertussis/diphtheria and tetanus toxoids and acellular pertussis, as well as for adults between the ages of 19 and 64 years who have not received Tdap, in addition to people older than 65 years who may be in contact with infants who have not received the vaccine.    The reason for the new recommendations is that infants, especially those younger than 2 months, which is when vaccination begins, are particularly vulnerable to developing severe illness and death from pertussis. Vaccinating pregnant women will pass antibodies to the newborn before and during birth. ACIP  advises administration after 20 weeks' gestation to minimize the confounding effects of early pregnancy loss in evaluating safety and to maximize the maternal antibodies transmitted to the fetus and infant, given that the half-life of such antibodies is 6 weeks. This new perinatal exposure should provide protection until the infant's first vaccination at 2 months.    In the [place] each year since 2004, a mean of 3055 infants contract pertussis, with 19 or more deaths, according to the National Notifiable Diseases Surveillance System of the US Centers for Disease Control and Prevention. The total number of cases reported for 2010 was 27,550.    Abundant evidence indicates that tetanus and diphtheria vaccines are safe during pregnancy. Although the possible teratogenicity of the vaccine was not initially studied, data from patient registries maintained by the vaccine manufacturers (Sanofi [employer]aste[employer] and [employer]) indicate that Tdap is safe.    We do recommend the vaccine for you between 28-36 weeks.  Please let us know if you have any questions.    Planning your NEXT Pregnancy    It may seem too early to talk about the next pregnancy during this one . . . but it's not.  Spacing babies too closely is not good for your health or theirs.  In addition, an unplanned pregnancy can be challenging for families to manage.  We encourage you to start talking about this with your partner, and involve us with your planning.      We want you to be able to plan your next pregnancy.  Reliable birth control methods   are readily available, easy to use, and can help you to decide when and whether to have another pregnancy.    We're experts in contraception.  Let's talk about it at your future visits.  Let's have a solid plan in place before you even have this baby.  Forget the rumors; forget what you've heard; bring an open mind to the possibilities.  We can help find a family planning method that is best for  you.    What does a Doula Do?       A doula is trained to give continuous, one-to-one support to women in labor.       She provides non-medical physical and emotional care to the birthing mother, and may also  lend a hand with communications between the mother, her family, and the hospital staff.       A doula's expertise is in offering comfort and reassurance. Her presence helps a laboring  woman feel safe and confident throughout labor, delivery, and the immediate postpartum period.     A doula can help with comfort measures such as relaxation, breathing, massage, and  positioning, means that the mother's partner can play an active support role with confidence.    A doula stays with a birthing woman continuously, no matter how long it takes and regardless of what pain medications are used, the type of delivery, or whether or not complications develop.      A doula does not provide clinical care or assessments. Nor does she replace the mother's partner, or other friends or family members she may choose to have present for the birth. In fact, the doula helps take care of them, too!    While nurses, doctors and midwives change shifts and must come and go to attend other patients, a doula remains by the side of a laboring woman and her family until her baby is born.       [facility] is special because we have a world-renowned volunteer doula service available on a first-come first-served basis for all of our patients.  ?  For more information about the [facility] Volunteer Doula program: Please contact Ann Fulcher [phone]    CONTACT INFORMATION  Please call the clinic M-F during business hours for questions or concerns    [facility]   [phone]  [facility]  [phone]  [facility]  [phone]    For after hours urgent questions or labor concerns      For MD patients delivering at [facility]   Call Labor and Delivery at   [phone]  OR  Page the Doctor on call for [facility]  [phone]    For midwife patients delivering at [facility]  Call [phone]    For MD or CNM patients delivering at [facility]  Call Labor and Delivery at  [phone]  OR  Call [phone] for the [facility] on call

## 2015-09-22 ENCOUNTER — Ambulatory Visit: Payer: Medicaid Other | Attending: Advanced Practice Midwife | Admitting: Advanced Practice Midwife

## 2015-09-22 VITALS — BP 100/69 | HR 98 | Temp 98.6°F | Ht 65.0 in | Wt 152.2 lb

## 2015-09-22 DIAGNOSIS — Z3493 Encounter for supervision of normal pregnancy, unspecified, third trimester: Principal | ICD-10-CM

## 2015-09-22 DIAGNOSIS — O9989 Other specified diseases and conditions complicating pregnancy, childbirth and the puerperium: Secondary | ICD-10-CM | POA: Insufficient documentation

## 2015-09-22 DIAGNOSIS — Z23 Encounter for immunization: Secondary | ICD-10-CM | POA: Insufficient documentation

## 2015-09-22 DIAGNOSIS — Z3490 Encounter for supervision of normal pregnancy, unspecified, unspecified trimester: Secondary | ICD-10-CM

## 2015-09-22 DIAGNOSIS — Z3402 Encounter for supervision of normal first pregnancy, second trimester: Secondary | ICD-10-CM

## 2015-09-22 DIAGNOSIS — Z630 Problems in relationship with spouse or partner: Secondary | ICD-10-CM

## 2015-09-22 DIAGNOSIS — Z283 Underimmunization status: Secondary | ICD-10-CM | POA: Insufficient documentation

## 2015-09-22 DIAGNOSIS — Z2839 Other underimmunization status: Secondary | ICD-10-CM

## 2015-09-22 DIAGNOSIS — A749 Chlamydial infection, unspecified: Secondary | ICD-10-CM

## 2015-09-22 NOTE — Progress Notes (Signed)
S: Here with her mother. Pt feels well, no cramping, LOF or VB. +FM. Has questions about episiotomy, birth plan, postpartum stay, rights of FOB. Has hd a few UCs, but never over more than 1 hr.  A: S=D at 33+2  P: PTL prec, [facility], calling CNM, peds, birth plan d/w pt. Tdap d/w pt and accepted. MSW consult ordered to help pt with legal questions. RTC 2 wks.  Lannie FieldsJ. Rudi Knippenberg, CNM [phone]

## 2015-09-22 NOTE — Patient Instructions (Signed)
Your pregnancy at 32 weeks    At 32 weeks, your baby is continuing to develop and grow different regions in the brain. You baby now has fingernails and toenails.    Continue to do your fetal movement counts once per day each day.  If you have any concerns about your baby's movements, or if it is taking longer than one hour to get your movement counts, call us right away.    If you are having painful cramping, any bleeding or are concerned you may be leaking fluid, please call us right away.    Breast Pumps    Many new mothers will benefit from having a good breast pump.  A breast pump can:  -help establish a good milk supply  -provide your milk to a newborn who might not be able to suck  -help a mother whose nipples are flat or inverted  -help obtain breast milk to store once your supply is well established.  Many health insurance companies now pay for you to have a breast pump (they know they'll save money in the long run since mothers and babies who breastfeed are healthier!).  Call your insurance company and find out if you need a prescription from us to obtain your pump.  We'll be more than happy to help.    Choosing a Pediatrician    It's important to start thinking about a provider for your baby.    The best place to start is with your insurance provider.  Baby will be covered by mother's insurance for approximately the first month of life.  Call mother's insurance and find out which newborn providers in the insurance network are near your home.    Ask friends and family about these providers and review their websites.  Ask us for recommendations about these providers as well.  Some newborn providers offer prenatal visits in order to learn more about their practice.  You can call the offices to see if they offer this option.    We have a medical team that will see your baby while in the hospital.  Bring the name of your chosen newborn provider with you to the hospital to facilitate communication. If you  haven't selected a newborn provider by the time you deliver, bring the list of providers covered by mother's insurance, and your baby's medical team can help you narrow your list.    [facility] outpatient newborn provider offices include:    Pediatrics and Adolescent Medicine  [place]  [address]  [place], [place] [address]  [phone]    Pediatrics  [place]  [address] [place] [address]  [place], [place] [address]  [phone]    Family Medicine  [facility]  [address]  [place], [place] [address]  [phone]    Family Medicine  [facility]  [address]  [place], [place] [address]  [phone]    Family Medicine  [facility]  [address]  [place], [place] [address]  [phone]    Some things to consider for your baby at home:  Infant car seat (you will need this to bring your baby home from the hospital)  A place to sleep  A place to change diapers  A support system for you  Nutrition    More information on birth control    Progestin-only or hormone free methods, including   pills, the implant, and the injection, and both types of intrauterine devices are safe when breastfeeding.     Even though unlikely to get pregnant while breastfeeding and in the first few weeks after childbirth, it is possible so please wait to let your body healed and you have decided on a birth control method before resuming intercourse    - PILLS: The "mini" pill is a birth control pill you take daily. Unlike the standard combined birth control pills where you have some flexibility, this pill must be taken the same time every day to be effective.  This pill combined with breastfeeding works well.  - IMPLANT: Nexplanon is a device the size of a matchstick inserted under the skin of your upper arm. It can stay in for up to 3 years and insertion is relatively painless. Common complaints include some irregular bleeding (usually light) but most are  satisfied with use.  - INJECTION: Depo-Provera is a progestin injection given every 10-12 weeks.  Common complaints include irregular bleeding (usually light) and increased appetite that can occasionally lead to weight gain.    - IUD: Intrauterine devices - There are progestin and hormone-free options available. Both require a placement in the office, and depending on the chosen device, can stay in place for 3-10 years, perfect for those who are not considering any future pregnancies or pregnancy in the next 2-5 years.   - CONDOMS    Methods containing estrogen, such as combination birth control pills, the vaginal ring, and the skin patch, should not be used during the first month of breastfeeding because they may decrease milk production.  Once your milk supply is well established, you may consider one of these methods.    Tubal sterilization    If you are considering tubal sterilization after delivery, you must be 100% sure you have complete childbearing. This is permanent and NOT reversible.    Please let us know as we need you to sign a consent form one month before you deliver for some insurances.  Signing the consent form does NOT obligate you to undergo sterilization, and we discuss the procedure and your wishes with you beforehand.    - POSTPARTUM TUBAL STERILIZATION - this is done as a separate surgical procedure after vaginal delivery. We can usually do this the day of or day after delivery with enough notice. It requires a spinal anesthetic and small incision near your belly button.  - TUBAL STERILIZATION DURING CESAREAN - if you deliver by cesarean, tubal sterilization can be done easily during this procedure.  - POST DELIVERY - done via laparoscopy or with a procedure called ESSURE (we can discuss further if you are interested)      CONTACT INFORMATION  Please call the clinic M-F during business hours for questions or concerns    [facility]   [phone]  [facility]  [phone]  [facility]  [phone]    For after hours urgent questions or labor concerns      For MD patients delivering at [facility]   Call Labor and Delivery at   [phone]  OR  Page the Doctor on call for [facility]  [phone]    For midwife patients delivering at [facility]  Call [phone]    For MD or CNM patients delivering at [facility]  Call Labor and Delivery at  [phone]  OR    Call [phone] for the [facility] on call  Your pregnancy at 34 weeks    At 34 weeks, your baby's lungs are maturing and getting ready to function outside your uterus. The skin is getting less wrinkly and smoothing out. He/She is laying down fat stores.    Continue to do your fetal movement counts once per day each day.  If you have any concerns about your baby's movements, or if it is taking longer than one hour to get your movement counts, call us right away.    If you are having painful cramping, any bleeding or are concerned you may be leaking fluid, please call us right away.    Group B Strep (GBS)     At your next visit we will be obtaining a culture for Group B Strep (GBS).  Group B streptococcus is a bacteria carried in the vagina or rectum by about 30% of women.  For most adults and children GBS is not a harmful bacteria.  However, if it gets into the blood of a newborn it can cause serious illness.     We will use two small Q-tips to collect a sample from your vagina and rectum.  The culture procedure takes just a few seconds and should not be uncomfortable.    If the culture is positive for GBS we will want to give you antibiotics when you're in labor to prevent infection for your baby.    If you are allergic to Penicillin, let your doctor or midwife know.    CONTACT INFORMATION  Please call the clinic M-F during business hours for questions or concerns    [facility]   [phone]  [facility]  [phone]  [facility]  [phone]    For after hours urgent questions or labor concerns      For MD patients delivering at [facility]   Call Labor and Delivery at   [phone]  OR  Page the Doctor on call for [facility]  [phone]    For midwife patients delivering at [facility]  Call [phone]    For MD or CNM patients delivering at [facility]  Call Labor and Delivery at  [phone]  OR  Call [phone] for the [facility] on call

## 2015-09-24 ENCOUNTER — Other Ambulatory Visit (HOSPITAL_BASED_OUTPATIENT_CLINIC_OR_DEPARTMENT_OTHER): Payer: Self-pay | Admitting: Advanced Practice Midwife

## 2015-09-24 ENCOUNTER — Encounter (HOSPITAL_BASED_OUTPATIENT_CLINIC_OR_DEPARTMENT_OTHER): Payer: Self-pay | Admitting: Advanced Practice Midwife

## 2015-09-24 DIAGNOSIS — K649 Unspecified hemorrhoids: Principal | ICD-10-CM

## 2015-09-24 NOTE — Telephone Encounter (Signed)
Refill request:  hydrocortisone 1 % cream    Pharmacy:  [id number] - , [place] - [address]    Request sent to MD.

## 2015-09-24 NOTE — Telephone Encounter (Signed)
From: Adaley Cortes-Espino  To: Ellison Hughsimpe, Beth K, CNM  Sent: 09/24/2015 9:38 AM PDT  Subject: Medication Renewal Request    Original authorizing provider: Ellison HughsBeth K Timpe, CNM    Kei Cortes-Espino would like a refill of the following medications:  hydrocortisone 1 % cream Ellison Hughs[Beth K Timpe, CNM]    Preferred pharmacy: [facility] - [place], [place] - [address]    Comment:

## 2015-09-27 ENCOUNTER — Telehealth (HOSPITAL_BASED_OUTPATIENT_CLINIC_OR_DEPARTMENT_OTHER): Payer: Self-pay | Admitting: Obstetrics & Gynecology

## 2015-09-27 ENCOUNTER — Encounter (HOSPITAL_COMMUNITY): Payer: Self-pay | Admitting: Advanced Practice Midwife

## 2015-09-27 DIAGNOSIS — O2240 Hemorrhoids in pregnancy, unspecified trimester: Secondary | ICD-10-CM

## 2015-09-27 MED ORDER — HYDROCORTISONE 1 % EX CREA
1.0000 | TOPICAL_CREAM | Freq: Two times a day (BID) | CUTANEOUS | 1 refills | Status: DC
Start: 2015-09-27 — End: 2015-11-09

## 2015-09-27 NOTE — Telephone Encounter (Signed)
Social Work Note:  This SW called patient per CNM consult request stating "separated from husband in [place] who kicked her out and has been unfaithful, but says he wants to come see the baby. Pt needs to know her rights and legal options."  NA.  Unable to leave a message as voice mail not set up.  [facility] and provide social services PRN.  Belva BertinL. Calsada, LCSW

## 2015-10-04 ENCOUNTER — Encounter (HOSPITAL_COMMUNITY): Payer: Self-pay | Admitting: Certified Nurse Midwife

## 2015-10-04 ENCOUNTER — Ambulatory Visit
Admission: AD | Admit: 2015-10-04 | Discharge: 2015-10-05 | Disposition: A | Discharge status: Home / Self Care | Payer: Medicaid Other | Attending: Obstetrics & Gynecology | Admitting: Obstetrics & Gynecology

## 2015-10-04 DIAGNOSIS — Z331 Pregnant state, incidental: Secondary | ICD-10-CM | POA: Insufficient documentation

## 2015-10-04 DIAGNOSIS — Z124 Encounter for screening for malignant neoplasm of cervix: Secondary | ICD-10-CM

## 2015-10-04 DIAGNOSIS — Z3490 Encounter for supervision of normal pregnancy, unspecified, unspecified trimester: Secondary | ICD-10-CM

## 2015-10-04 NOTE — Progress Notes (Signed)
Labor and Delivery Triage Assessment      History of Present Illness:  Lauren Booth [id number] is a 30 year old G2P0010 at 4446w0d EGA who presents for a chief complaint of contractions.  Pt was here for the tour and started having regular/strong [place]'s.  Denies SROM, BRVB, decreased FM, S&S Pre-E, S&S vaginitis.    Symptom(s) location:   Leaking of fluid: No  Contractions: No  Pre-eclampsia symptoms: No  Bleeding: No  Dysuria: No  Normal fetal movement: yes    Comprehensive PMFSH (past medical family social history) which was performed during a previous encounter was re-examined and reviewed with the patient.  There is nothing new to add today.     Allergies:  No Known Allergies    Review of Systems:   A comprehensive review of systems was negative except for: Pos CT in Pg.  Was planning on doing repeat CT/GC tomorrow and do GBS.    Vitals:  Patient Vitals for the past 24 hrs:   BP Temp Pulse Resp   10/04/15 2015 97/58 98.1 F (36.7 C) 79 18         General appearance: in no apparent distress and well developed and well nourished  Heart: normal and regular rate and rhythm  Lungs:clear to auscultation  Abdomen: Abdomen soft, non-tender. BS normal. No masses, organomegaly  Extremities: Normal, No cyanosis, clubbing and No edema  Neuro: Neg clonus, 2+ DTRs  Pelvic: Normal external genitalia, SVE: Closed/0cm / 75% / -1 station / mid position / medium consistency Vtx.    FHR: baseline 125, mod variability, pos accelerations, no decelerations  Uterine Activity: Q 1-4 mins x 60-80 secs, mod per palpation.    TVCL: NI  Abdominal US: Limited 3rd T U/S done:  SIUP w/+ FCA.  AFI 8.35 cms w/> 2x2 cm pocket noted and full fetal bladder noted.  Cephalic (ROP).Placenta is anterior.    Labs: UA w/reflex.  CT/GC sent via urine  GBS obtained    Assessment/Plan:  Lauren Booth is a 30 year old G2P0010 presents for PTL [place]'s.  1. PO Hydration encouraged.  2. Fetal Well-being: FHR tracing Cat 1  3. Above labs sent.  4.  Dispo: c/w Dr.  Willeen CassBennett.  Ok to d/c to home undelivered.  Steroids NI.  5.  Ok to cancel ROB appt on 10/05/15 and RTC in 1 week.  6.  Comfort measures-PO hydration, extra strength Tylenol, warm tub soaks.    Discharge instructions and precautions reviewed with patient.    Future Appointments  Date Time Provider Department Center   10/05/2015 1:00 PM Ellison Hughsimpe, Beth K, CNM [facility]   10/19/2015 3:45 PM Ellison Hughsimpe, Beth K, CNM [facility]   10/26/2015 1:15 PM Lewie ChamberCortes, Christine A, CNM [facility]   11/02/2015 1:00 PM Ellison Hughsimpe, Beth K, CNM [facility]   11/09/2015 1:00 PM Geralyn FlashHirsch, Jennifer S, CNM [facility]   11/16/2015 1:00 PM Ellison Hughsimpe, Beth K, CNM [facility]   12/21/2015 10:00 AM Ellison Hughsimpe, Beth K, CNM [facility]       10/04/2015   11:34 PM     Achille Richebecca C. Garrett-Brown, CNM  [phone]#[id number]

## 2015-10-04 NOTE — Interdisciplinary (Signed)
Lauren Booth is a 30 year old G2P0010 at 1659w0d presenting to labor & delivery complaining of regular painful uterine contractions.  [facility] when the contractions started.      Medical / OB History:   Patient Active Problem List   Diagnosis   . Encounter for supervision of normal first pregnancy in second trimester   . Stress due to marital problems   . Hx of breast implants, bilateral   . Chlamydia   . Rubella non-immune status, antepartum   . Hemorrhoids during pregnancy       Chief Compliant:  Chief Complaint   Patient presents with   . R/O Preterm Labor       Uterine Contractions: Yes: [place] started at 1800, are 2-4 minutes apart and are lasting 60-80 seconds.    Rupture of Membrane: no    Vaginal Bleeding: no    Fetal Movement:Active    Previous C/S: no    Pre-eclampsia symptoms: no    Urinary tract infection symptoms: no      External fetal monitor and toco applied. VS obtained. Side rails up x2. Call light in reach. Patient awaiting evaluation by provider.    Terrance Masseodora Marie Montario Zilka, RN, RN.

## 2015-10-05 ENCOUNTER — Ambulatory Visit: Payer: Medicaid Other | Attending: Advanced Practice Midwife | Admitting: Advanced Practice Midwife

## 2015-10-05 VITALS — BP 112/69 | HR 90 | Temp 98.1°F | Ht 65.0 in | Wt 156.4 lb

## 2015-10-05 DIAGNOSIS — Z3493 Encounter for supervision of normal pregnancy, unspecified, third trimester: Principal | ICD-10-CM

## 2015-10-05 DIAGNOSIS — Z3402 Encounter for supervision of normal first pregnancy, second trimester: Secondary | ICD-10-CM

## 2015-10-05 DIAGNOSIS — O4703 False labor before 37 completed weeks of gestation, third trimester: Secondary | ICD-10-CM

## 2015-10-05 DIAGNOSIS — Z3A35 35 weeks gestation of pregnancy: Secondary | ICD-10-CM

## 2015-10-05 LAB — URINALYSIS WITH CULTURE REFLEX, WHEN INDICATED
Bilirubin: NEGATIVE
Blood: NEGATIVE
Glucose: NEGATIVE
Ketones: NEGATIVE
Leuk Esterase: NEGATIVE
Nitrite: NEGATIVE
Protein: NEGATIVE
Specific Gravity: 1.016 (ref 1.002–1.030)
Urobilinogen: NEGATIVE
pH: 6 (ref 5.0–8.0)

## 2015-10-05 NOTE — Progress Notes (Signed)
Here with mom.  Saw SW before visit with me.  GC/CT/ GBS pending from L&D visit for R/O PTL.  Pt now comfortable.  Cephalic per sono in L&D today.  Rev s/s labor, calling 4-1-1.  Per Gwen PoundsXanthe, no Quad results.  Pt has no complaints, positive fetal movement, denies contractions, bleeding, or leaking fluids. Exam consistent with dates.  Ellison HughsBeth K Laniqua Torrens, CNM
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
## 2015-10-05 NOTE — Interdisciplinary (Signed)
Written and verbal d/c instructions provided to patient including preterm labor precautions and instructed to continue fetal movement counting. Patients questions answered. Patient given number to labor and delivery to call with any further questions, concerns, or worsening symptoms. Patient verbalized understanding of discharge instructions. Patient discharged home ambulatory.

## 2015-10-05 NOTE — Discharge Instructions (Signed)
Obstetrical Outpatient Discharge Instructions:    Labor & Delivery Location:  [facility]  [address]  [place] [id number]  [phone]                        Based on the medical evaluation completed by our staff, it has been determined that you are NOT IN NEED OF EMERGENCY OBSTETRICAL SERVICES AT THIS TIME.     If and when any of the symptoms noted below occur, you are advised to go to the hospital closest to your home that provides obstetrical services or to the hospital that you and your health care provider have agreed upon.    Please follow the instructions for:    FETAL KICK COUNTS    1. Count the babys movement every night.  2. A movement may be a kick, swish or roll. Do not count hiccups or small flutters.  3. Count babys movements while lying down, preferably on your left side,  preferably after a meal.  4. Mark down the time you feel the baby move for the first time.  5. Mark down the time you feel the tenth fetal movement.  6. You should feel at least 10 fetal movements within one hour.  7. Call Labor and Delivery immediately if:  a. You do not feel 10 movements within one hour  b. It takes longer and longer for your baby to move 10 times  c. You have not felt your baby move all day.        PRETERM LABOR PRECAUTIONS    1. Regular uterine tightening  2. Low, dull backache.  3. Menstrual cramps  4. Pressure in your pelvis  5. Leaking or gushing fluid from your vagina.  6. Changes in vaginal discharge; watery, bloody or mucousy.        PREGNANCY INDUCED HYPERTENSION    1. Headache: sudden or severe.  2. Visual problems: blurred or double vision or seeing spots  3. Pain under your ribs, right over your stomach  4. Swelling of your face and hands  5. Swelling of your ankles or feet after 12-hour rest  6. Decrease in the amount of your urine  7. Rapid weight gain: 4 or 5 pounds in one week.      APPOINTMENT: Ok to cancel your ROB appt on  10/05/15.  Achille Richebecca C. Garrett-Brown, CNM [phone]#[id number]

## 2015-10-05 NOTE — Patient Instructions (Signed)
Your pregnancy at 34 weeks    At 34 weeks, your baby’s lungs are maturing and getting ready to function outside your uterus. The skin is getting less wrinkly and smoothing out. He/She is laying down fat stores.    Continue to do your fetal movement counts once per day each day.  If you have any concerns about your baby's movements, or if it is taking longer than one hour to get your movement counts, call us right away.    If you are having painful cramping, any bleeding or are concerned you may be leaking fluid, please call us right away.    Group B Strep (GBS)     At your next visit we will be obtaining a culture for Group B Strep (GBS).  Group B streptococcus is a bacteria carried in the vagina or rectum by about 30% of women.  For most adults and children GBS is not a harmful bacteria.  However, if it gets into the blood of a newborn it can cause serious illness.     We will use two small Q-tips to collect a sample from your vagina and rectum.  The culture procedure takes just a few seconds and should not be uncomfortable.    If the culture is positive for GBS we will want to give you antibiotics when you’re in labor to prevent infection for your baby.    If you are allergic to Penicillin, let your doctor or midwife know.    CONTACT INFORMATION  Please call the clinic M-F during business hours for questions or concerns    [facility]   [phone]  [facility]  [phone]  [facility]  [phone]    For after hours urgent questions or labor concerns      For MD patients delivering at [facility]   Call Labor and Delivery at   [phone]  OR  Page the Doctor on call for [facility]  [phone]    For midwife patients delivering at [facility]  Call [phone]    For MD or CNM patients delivering at [facility]  Call Labor and Delivery at  [phone]  OR  Call [phone]  for the [facility] Doctor on call

## 2015-10-07 LAB — CHLAMYDIA/GONORRHEA PCR, URINE
Chlamydia trachomatis PCR, Urine: NOT DETECTED
Neisseria gonorrhoeae PCR, Urine: NOT DETECTED

## 2015-10-08 LAB — GROUP B STREP CULTURE: Strep Group B Culture Result: NO GROWTH

## 2015-10-12 ENCOUNTER — Telehealth (HOSPITAL_BASED_OUTPATIENT_CLINIC_OR_DEPARTMENT_OTHER): Payer: Self-pay | Admitting: Advanced Practice Midwife

## 2015-10-16 LAB — EMMI , CHILDBIRTH: EMMI Video Order Number: 16324696992

## 2015-10-19 ENCOUNTER — Ambulatory Visit (HOSPITAL_BASED_OUTPATIENT_CLINIC_OR_DEPARTMENT_OTHER): Payer: Medicaid Other | Admitting: Advanced Practice Midwife

## 2015-10-19 ENCOUNTER — Ambulatory Visit: Payer: Medicaid Other | Attending: Advanced Practice Midwife | Admitting: Advanced Practice Midwife

## 2015-10-19 VITALS — BP 99/71 | HR 99 | Ht 65.0 in | Wt 155.6 lb

## 2015-10-19 DIAGNOSIS — A749 Chlamydial infection, unspecified: Secondary | ICD-10-CM

## 2015-10-19 DIAGNOSIS — Z3402 Encounter for supervision of normal first pregnancy, second trimester: Secondary | ICD-10-CM

## 2015-10-19 DIAGNOSIS — Z3493 Encounter for supervision of normal pregnancy, unspecified, third trimester: Principal | ICD-10-CM

## 2015-10-19 LAB — EMMI , BREASTFEEDING: EMMI Video Order Number: 15893919041

## 2015-10-19 LAB — EMMI, NUTRITION AND EXERCISE DURING PREGNANCY: EMMI Video Order Number: 17826455390

## 2015-10-19 NOTE — Patient Instructions (Signed)
Your pregnancy at 37 weeks    At 37 weeks, your baby is considered full-term. Babies at this age are continuing to grow and put on weight. Your baby can grow up to a half a pound a week at this stage.    Do your fetal movement counts once per day each day.  If you have any concerns about your baby's movements, or if it is taking longer than one hour to get your movement counts, call us right away.    Call or go to Labor and Delivery if you are having any of the following:    1.  Leaking fluid or think that you have broken your water,  2.  Vaginal bleeding,  3.  Painful contractions occurring every 5 minutes for 2 hours or more (every 10 minutes for one hour if you have delivered a baby before).    What to expect after the birth    - Immediately after your baby is born, as long as there is no concern about her/his wellbeing, we will place your baby skin-to-skin on your abdomen or chest. This helps regulate your baby's heart rate and temperature, and helps with bonding and breastfeeding. In some cases, if your baby is having difficulty adjusting after birth, we may recommend evaluation at the baby warmer for a few minutes, which is located right near your bed. If there was meconium in your amniotic fluid (if your baby pooped while still inside), a pediatrician may be present at birth and will likely want to evaluate your baby and clear any fluid from his or her mouth before s/he goes skin-to-skin with you. We will be very clear in our communication with you if this is the case. We routinely delay clamping the umbilical cord so that your baby can get all of her/his blood from the placenta before the cord is cut. We usually ask if your partner would like to cut the cord.     - After your baby is born, we need to deliver your placenta and make sure your uterus contracts.  This helps prevent excessive bleeding. We recommend Pitocin (either by IV or an injection if you have no IV) after your baby's birth, which helps  your placenta deliver and helps to reduce your blood loss. We may ask you to push slightly to help your placenta come out. Placenta delivery can take from 3 to 20 minutes.  Next we massage your abdomen to help your uterus contract. This can be uncomfortable, but is very important for your health postpartum. If you are bleeding more than expected, we may give additional medications to help control bleeding. It is common, especially in first births, to have small vaginal or perineal lacerations ("tears"), and often stitches are necessary. If you don't already have an epidural, you will receive local anesthesia (numbing medication) before we stitch any tears. It is also common to have swelling and soreness after delivery - you will be offered ice packs and medication such as ibuprofen or Tylenol for this pain.    CONTACT INFORMATION  Please call the clinic M-F during business hours for questions or concerns    [facility]   [phone]  [facility]  [phone]  [facility]  [phone]    For after hours urgent questions or labor concerns      For MD patients delivering at [facility]   Call Labor and Delivery at   [phone]  OR  Page the Doctor on call for [facility]  [phone]    For midwife patients delivering at [facility]  Call [phone]    For MD or CNM patients delivering at [facility]  Call Labor and Delivery at  [phone]  OR  Call [phone] for the [facility] Doctor on call

## 2015-10-19 NOTE — Progress Notes (Signed)
S: Here with her mother. Pt feels well, no LOF or VB. +FM. Has had some cramping, as much as every 4 minutes, but since they didn't seem to be too bad she would just go to sleep.   A: S=D at 37+1  P: Labor prec, [facility], calling CNM, peds d/w pt. RTC 1 wks.  Lannie FieldsJ. Zanasia Hickson, CNM [phone]

## 2015-10-26 ENCOUNTER — Ambulatory Visit: Payer: Medicaid Other | Attending: Advanced Practice Midwife | Admitting: Advanced Practice Midwife

## 2015-10-26 VITALS — BP 113/77 | HR 95 | Temp 98.0°F | Ht 65.0 in | Wt 158.0 lb

## 2015-10-26 DIAGNOSIS — Z3402 Encounter for supervision of normal first pregnancy, second trimester: Secondary | ICD-10-CM

## 2015-10-26 DIAGNOSIS — O99343 Other mental disorders complicating pregnancy, third trimester: Secondary | ICD-10-CM

## 2015-10-26 DIAGNOSIS — Z3493 Encounter for supervision of normal pregnancy, unspecified, third trimester: Principal | ICD-10-CM

## 2015-10-26 DIAGNOSIS — O09213 Supervision of pregnancy with history of pre-term labor, third trimester: Secondary | ICD-10-CM

## 2015-10-26 DIAGNOSIS — Z124 Encounter for screening for malignant neoplasm of cervix: Secondary | ICD-10-CM

## 2015-10-26 NOTE — Patient Instructions (Signed)
Your pregnancy at 39 weeks    At 39 weeks, your baby is getting ready for birth. All your baby’s organs are fully functional and your baby continues to build up fat stores.    Do your fetal movement counts once per day each day.  If you have any concerns about your baby's movements, or if it is taking longer than one hour to get your movement counts, call us right away.    Call or go to Labor and Delivery if you are having any of the following:    1.  Leaking fluid or think that you have broken your water,  2.  Vaginal bleeding,  3.  Painful contractions occurring every 5 minutes for 2 hours or more (every 10 minutes for one hour if you have delivered a baby before).    Pain Relief Options    At [facility] we offer multiple options for pain relief during labor. Some women may choose to labor without pain medications. If you decide that you need help with pain during labor, let us know. Options include IV narcotic pain medications early in labor, inhaled nitrous oxide or an epidural from one of our anesthesiologists.    CONTACT INFORMATION  Please call the clinic M-F during business hours for questions or concerns    [facility]   [phone]  [facility]  [phone]  [facility]  [phone]    For after hours urgent questions or labor concerns      For MD patients delivering at [facility]   Call Labor and Delivery at   [phone]  OR  Page the Doctor on call for [facility]  [phone]    For midwife patients delivering at [facility]  Call [phone]    For MD or CNM patients delivering at [facility]  Call Labor and Delivery at  [phone]  OR  Call [phone] for the [facility] Doctor on call  Your pregnancy at 40 weeks     Your baby has reached his/her due date, and labor may happen at any time. Did you know that the bones in your baby’s skull are flexible,  allowing for navigation through the birth canal?    Do your fetal movement counts once per day each day.  If you have any concerns about your baby's movements, or if it is taking longer than one hour to get your movement counts, call us right away.    Call or go to Labor and Delivery if you are having any of the following:    1.  Leaking fluid or think that you have broken your water,  2.  Vaginal bleeding,  3.  Painful contractions occurring every 5 minutes for 2 hours or more (every 10 minutes for one hour if you have delivered a baby before).    If you do not go into labor naturally OR if there is a medical indication, we will discuss induction of labor.    Going past your due date and labor induction - ACOG FAQ’s    What is post term pregnancy?    A post term pregnancy is one that lasts 42 weeks or longer. Women who are having a baby for the first time or who have had post term pregnancies before may give birth later than expected.     What are the risks associated with post term pregnancy?      After 42 weeks, your placenta may not work as well as it did earlier in pregnancy. Also, as the baby grows, the amount of amniotic fluid may begin to decrease. Less fluid may cause the umbilical cord to become pinched as the baby moves or as your uterus contracts.  If pregnancy goes past 42 weeks, a baby has an increased risk of certain problems, such as dysmaturity syndrome, macrosomia, or meconium aspiration. There also is an increased chance of cesarean delivery.     What tests can be performed in cases of post term pregnancy?  When a baby is not born by the due date, tests can help the health care provider check on the baby’s health. Some tests, such as a kick count, can be done on your own at home. A kick count is a record of how often you feel your baby move. Others are done in the health care provider’s office or in the hospital. These tests involve electronic fetal monitoring and include the non-stress test,  biophysical profile, and contraction stress test.  We will do a Non-Stress test at 41 weeks if you have not delivered your baby.    What is labor induction?    Labor induction is the use of medication or other methods to bring on labor. Labor is induced to cause a pregnant woman’s cervix to open and to prepare for vaginal birth.   We may recommend this if you are overdue or if your placenta is showing any signs of deterioration.    CONTACT INFORMATION  Please call the clinic M-F during business hours for questions or concerns    [facility]   [phone]  [facility]  [phone]  [facility]  [phone]    For after hours urgent questions or labor concerns      For MD patients delivering at [facility]   Call Labor and Delivery at   [phone]  OR  Page the Doctor on call for [facility]  [phone]    For midwife patients delivering at [facility]  Call [phone]    For MD or CNM patients delivering at [facility]  Call Labor and Delivery at  [phone]  OR  Call [phone] for the [facility] on call

## 2015-10-26 NOTE — Progress Notes (Signed)
10/26/2015 1:30 PM    Lauren Booth 30 year old female [id number]  G2P0010 2928w1d    S: Pt doing well. Here with her Mom today. Brought prenatal records with her today from outside provider in [place] (1st tri ultrasound report and Colpo/Bone BX information). Worried we are using the wrong EDD and "doesn't want there to be complications toward the end of her pregnancy". Denies leaking of fluid, vaginal bleeding, [place]'s, headaches, RUQ pain or blurry vision. She reports reassuring FM daily.     O: see flowsheet  BP 113/77 (BP cuff site: Right;Upper;Arm, BP Patient Position: Sitting, BP cuff size: Regular)  Pulse 95  Temp 98 F (36.7 C) (Oral)  Ht 5\' 5"  (1.651 m)  Wt 71.7 kg (158 lb)  LMP 01/21/2015  BMI 26.29 kg/m2   First trimester ultrasound report reviewed and copy scanned into media. Will use LMP EDD since it was certain and 7+1 wk ultrasound was cwd.      A:30 year old G2P0010 @ 3228w1d, TWG 36#  S=D, +FHT's by doppler  Patient Active Problem List   Diagnosis   . Encounter for supervision of normal first pregnancy in second trimester   . Stress due to marital problems   . Hx of breast implants, bilateral   . Chlamydia   . Rubella non-immune status, antepartum   . Hemorrhoids during pregnancy   . Preterm labor in third trimester without delivery   . Screening for cervical cancer     P: Informed Patient she is BC eligible  ANT ordered today-patient aware to start at 41 wks (at [place] is fine at 40+5 if she desire)  Reviewed records patient brought today. Early 7+1 wk ultrasound results confirm patient's certain, regular LMP EDD of 10/28/15. Patient aware will will use LMP EDD per ACOG dating.   Abnormal pap, colpo, biopsy results reviewed and copies scanned into media. Patient will need f/u postpartum  Recent labs reviewed and pregnancy checklist updated  Disability papers to office staff for signing.   Discussed traffic, parking, [facility] near [facility] and 4-1-1.Reviewed Labor, spontaneous rupture of membranes,  FMC precautions. RTC 1 wk     RTC: weekly    Riley KillChristine Cortes, CNM  PID# [id number]    No orders of the defined types were placed in this encounter.    Future Appointments  Date Time Provider Department Center   11/02/2015 1:00 PM Ellison Hughsimpe, Beth K, CNM [facility]   11/09/2015 1:00 PM Geralyn FlashHirsch, Jennifer S, CNM [facility]   11/16/2015 1:00 PM Timpe, Beth K, CNM [facility]   12/21/2015 10:00 AM Timpe, Beth K, CNM MOS [facility]

## 2015-10-29 ENCOUNTER — Ambulatory Visit
Admission: AD | Admit: 2015-10-29 | Discharge: 2015-10-29 | Disposition: A | Discharge status: Home / Self Care | Payer: Medicaid Other | Attending: Obstetrics & Gynecology | Admitting: Obstetrics & Gynecology

## 2015-10-29 ENCOUNTER — Telehealth (INDEPENDENT_AMBULATORY_CARE_PROVIDER_SITE_OTHER): Payer: Self-pay | Admitting: Ob/Gyn

## 2015-10-29 DIAGNOSIS — O09899 Supervision of other high risk pregnancies, unspecified trimester: Secondary | ICD-10-CM

## 2015-10-29 DIAGNOSIS — Z283 Underimmunization status: Secondary | ICD-10-CM | POA: Insufficient documentation

## 2015-10-29 DIAGNOSIS — O9989 Other specified diseases and conditions complicating pregnancy, childbirth and the puerperium: Secondary | ICD-10-CM | POA: Insufficient documentation

## 2015-10-29 DIAGNOSIS — O26893 Other specified pregnancy related conditions, third trimester: Secondary | ICD-10-CM

## 2015-10-29 DIAGNOSIS — Z3402 Encounter for supervision of normal first pregnancy, second trimester: Secondary | ICD-10-CM | POA: Insufficient documentation

## 2015-10-29 DIAGNOSIS — N898 Other specified noninflammatory disorders of vagina: Principal | ICD-10-CM | POA: Insufficient documentation

## 2015-10-29 DIAGNOSIS — A749 Chlamydial infection, unspecified: Secondary | ICD-10-CM | POA: Insufficient documentation

## 2015-10-29 DIAGNOSIS — Z630 Problems in relationship with spouse or partner: Secondary | ICD-10-CM | POA: Insufficient documentation

## 2015-10-29 DIAGNOSIS — Z9882 Breast implant status: Secondary | ICD-10-CM | POA: Insufficient documentation

## 2015-10-29 NOTE — Progress Notes (Signed)
Labor and Delivery Triage Assessment      History of Present Illness:  Lauren Booth [id number] is a 30 year old G2P0010 at [redacted]w[redacted]d EGA who presents for a chief complaint of leakage of fluid. She woke up this morning at 0700 with her panties completely wet with clear, odorless fluid. She states that there have been very small amounts of leakage since then. She has been contracting q 2-3 minutes, but is still able to talk and smile through them, sometimes has back pain with a contraction.   She also complained of some tachycardia this morning up to 129 bpm, feeling flush and off, took some vitamins and felt better. Only had hot chocolate and a piece of bread this morning. Has had a couple episodes of diarrhea today w/ some stomach upset, now improving.    Symptom(s) location: See above    Leaking of fluid: Yes - See above  Contractions: Yes - See above  Pre-eclampsia symptoms: No  Bleeding: No  Dysuria: No  Normal fetal movement: yes    Comprehensive PMFSH (past medical family social history) which was performed during a previous encounter was re-examined and reviewed with the patient.  There is nothing new to add today.     Allergies:  No Known Allergies    Review of Systems:   A comprehensive review of systems was negative except for as noted above    Vitals:  Patient Vitals for the past 24 hrs:   BP Temp Pulse Resp   10/29/15 1215 110/71 98.6 F (37 C) 86 18         General appearance: in no apparent distress, well developed and well nourished, alert, oriented times 3 and well groomed and dressed  Heart: regular rate and rhythm and no murmurs, clicks, or gallops  Lungs:clear to auscultation  Abdomen: Abdomen soft, non-tender. BS normal. No masses, organomegaly  Extremities: No cyanosis, clubbing and No edema  Neuro: 1+ DTRs  Pelvic: Normal external genitalia and Bartholin's glands, urethra, Skene's glands negative, vagina  Normal mucosa, discharge egg white consistency, clear/white, ferning  No, pooling  No  and nitrazine negative    FHR: baseline 135 bpm, increases above baseline auscultated via Doppler, no decreases below baseline heard  Uterine Activity: q 2-3 minutes per patient report. Mild to palpation    TVCL: Not indicated  Abdominal US: fetus vertex, placenta anterior, AFI 9.2 cm    Labs: None indicated    Assessment/Plan:  Wynona Duhamel is a 30 year old G2P0010 presents for rule out SROM  1. Discussed negative SSE results, vaginal leukorrhea rather than SROM. We reviewed how to tell the difference, also labor s/sx, importance of normal FM, when to call CNM pager #  2. Fetal Well-being: Reassuring fetal status  3. Pt encouraged to rest, hydrate, and eat a well balanced diet. Tachycardia and feeling flush earlier likely r/t hypoglycemia as it occurred after a high carb snack first thing in the morning and resolved quickly. I encouraged protein rich foods, call if diarrhea persistent  4. Dispo: Home with precautions and routine f/u    Discharge instructions and precautions reviewed with patient.    Future Appointments  Date Time Provider Department Center   11/02/2015 1:00 PM Ellison Hughs, CNM [facility]   11/02/2015 1:45 PM Hcnonstr MOS [facility]   11/05/2015 11:00 AM Hcnonstr MOS [facility]   11/09/2015 1:00 PM Geralyn Flash, CNM [facility]   11/09/2015 1:45 PM Hcnonstr MOS [facility]  11/16/2015 1:00 PM Timpe, Beth K, CNM [facility]   12/21/2015 10:00 AM Ellison Hughsimpe, Beth K, CNM MOS [facility]         10/29/2015   1:58 PM     Gwyneth SproutAmanda J Kashana Breach, CNM

## 2015-10-29 NOTE — Telephone Encounter (Signed)
CNM Telephone Call    Lauren Booth is a 30 year old G2P0 at 454w1d calling to report onset of leaking of fluid at 0700 this morning. She woke up and her underwear were soaked wet with clear fluid, has continued to leak small amounts of fluid since. She also reports moderate strength contractions every 2-3 minutes. She endorses normal fetal movement. [facility]. She agrees to come in now.     Dannielle BurnAmanda Julyana Woolverton, CNM

## 2015-10-29 NOTE — Discharge Instructions (Signed)
Obstetrical Outpatient Discharge Instructions:    Labor & Delivery Location:  [facility]  [address]th [address]  [place], [place] [id number]  [phone]    Your next midwife appointment is 11/02/15 at 1:00PM                        Based on the medical evaluation completed by our staff, it has been determined that you are NOT IN NEED OF EMERGENCY OBSTETRICAL SERVICES AT THIS TIME.     If and when any of the symptoms noted below occur, you are advised to go to the hospital closest to your home that provides obstetrical services or to the hospital that you and your health care provider have agreed upon.    Please follow the instructions for:    LABOR PRECAUTIONS    1. Contractions every 5 minutes or less.  2. Temperature higher than 100.5 F.  3. Vaginal bleeding  4. Ruptured membranes (bag of water broken).  5. Decreased fetal movement        FETAL KICK COUNTS    1. Count the babys movement every night.  2. A movement may be a kick, swish or roll. Do not count hiccups or small flutters.  3. Count babys movements while lying down, preferably on your left side,  preferably after a meal.  4. Mark down the time you feel the baby move for the first time.  5. Mark down the time you feel the tenth fetal movement.  6. You should feel at least 10 fetal movements within one hour.  7. Call Labor and Delivery immediately if:  a. You do not feel 10 movements within one hour  b. It takes longer and longer for your baby to move 10 times  c. You have not felt your baby move all day.

## 2015-10-29 NOTE — Interdisciplinary (Signed)
Era Skeenlejandra Cortes-Espino is a 30 year old G2P0010 at 8177w1d presenting to labor & delivery complaining of LOF.    Medical / OB History:   Patient Active Problem List   Diagnosis   . Encounter for supervision of normal first pregnancy in second trimester   . Stress due to marital problems   . Hx of breast implants, bilateral   . Chlamydia   . Rubella non-immune status, antepartum   . Hemorrhoids during pregnancy   . Preterm labor in third trimester without delivery   . Screening for cervical cancer       Chief Compliant:  No chief complaint on file.      Uterine Contractions: Yes: [place] started at 0700, are 3-10 minutes apart and are lasting 60 seconds.    Rupture of Membrane: yes/potentially; date: today 10/29/15 time: 0700 Fluid Amount: Scant Color:  clear    Vaginal Bleeding: yes;  amount: "Small drops Dark/old blood"    Fetal Movement:Active    Previous C/S: no    Pre-eclampsia symptoms: no    Urinary tract infection symptoms: no      External fetal monitor and toco applied. VS obtained. Side rails up x2. Call light in reach. Patient awaiting evaluation by provider.    Rivka SaferBrittany Talise Sligh, RN, RN.

## 2015-10-29 NOTE — Interdisciplinary (Signed)
Per CNM Barton FannyBecerra ok to discharge patient home.  Written and verbal d/c instructions provided to patient including labor precautions and instructed to continue fetal movement counting. Patients questions answered.  Patient given number to labor and delivery to call with any further questions, concerns, or worsening symptoms. Patient verbalized understanding of discharge instructions. Patient discharged home ambulatory. Patient to follow up in clinic as scheduled below:    Future Appointments  Date Time Provider Department Center   11/02/2015 1:00 PM Ellison Hughsimpe, Beth K, CNM MOS [facility]   11/02/2015 1:45 PM Hcnonstr MOS [facility] MOS   11/05/2015 11:00 AM Hcnonstr MOS [facility] MOS   11/09/2015 1:00 PM Geralyn FlashHirsch, Jennifer S, CNM MOS [facility] MOS   11/09/2015 1:45 PM Hcnonstr MOS [facility] MOS   11/16/2015 1:00 PM Timpe, Beth K, CNM MOS [facility]   12/21/2015 10:00 AM Timpe, Beth K, CNM MOS [facility] MOS

## 2015-11-02 ENCOUNTER — Telehealth (HOSPITAL_COMMUNITY): Payer: Self-pay

## 2015-11-02 ENCOUNTER — Telehealth (HOSPITAL_BASED_OUTPATIENT_CLINIC_OR_DEPARTMENT_OTHER): Payer: Self-pay | Admitting: Advanced Practice Midwife

## 2015-11-02 ENCOUNTER — Ambulatory Visit (HOSPITAL_BASED_OUTPATIENT_CLINIC_OR_DEPARTMENT_OTHER): Payer: Medicaid Other

## 2015-11-02 ENCOUNTER — Ambulatory Visit: Payer: Medicaid Other | Attending: Obstetrics & Gynecology | Admitting: Advanced Practice Midwife

## 2015-11-02 VITALS — BP 122/74 | HR 92 | Temp 98.0°F | Ht 65.0 in | Wt 157.4 lb

## 2015-11-02 DIAGNOSIS — O480 Post-term pregnancy: Principal | ICD-10-CM

## 2015-11-02 DIAGNOSIS — Z3493 Encounter for supervision of normal pregnancy, unspecified, third trimester: Principal | ICD-10-CM | POA: Insufficient documentation

## 2015-11-02 NOTE — Telephone Encounter (Signed)
Pt is a G1P0 @ 40+5 wks calling regarding early labor. Started having some tightening and ucs since 1600. Now every 3 min for 60 seconds,  Starting to get " very " painful.  Denies  LOF or VB. Reports normal FM today. Has been walking  at home for comfort and was in the shower for  A bit. she  Was 1cm dilated today at clinic. Has her husband for support. Patient lives 45 min away and desires to come in for eval. [facility] Pt v/u and agrees with plan.   Verne SpurrErica Vu, [place] [phone]

## 2015-11-02 NOTE — Patient Instructions (Addendum)
Preparing Your Body for Labor and Birth    Our babies come when they are ready, but we can also encourage them and prepare our babies with some of the following techniques, foods and supplements...     Frequent Intercourse:  There is a prostaglandin effect from sperm that activates or "ripens"/softens the cervix. After intercourse, raise your bottom onto several pillows to allow the semen to soak the cervix.     Optimal Fetal Positioning: Visit www.spinningbabies.com and begin the following exercises: (1) forward-leaning inversion, (2) side-lying release, (3) rebozo sifting     Red Raspberry Leaf Tea: For uterine muscle activation, you can start this at 36 weeks as a preparation for labor, and can continue until 6 weeks postpartum (sold at [employer] and most organic grocery stores. Yogi Tea is a common brand). Drink 2-3 cups per day.           Dates (fruit): Starting at 36 weeks, begin eating 6 dates per day. If you have gestational diabetes please discuss with your midwife first.              Clay[place] Artist[There is a 2008 study by [employer] that looked at 103 women during prenatal care. 69 women were instructed to consume 6 dates (dried fruit) per day starting at 36 weeks. 45 women were not instructed to add dates to their diet. These women varied in age and estimated due dates. The women who consumed dates within 4 weeks of their due date presented with more favorable cervical dilation in labor (3.52cm versus 2.02cm in the non-date fruit consumers). The study also concluded that 96% of the date fruit consuming population of women went into spontaneous labor compared to 79% of the non-date fruit consumers.]      Evening Primrose Oil: Can take orally or vaginally. For vaginal use, insert capsule(s) at bedtimes as close to your cervix as you can reach and they will dissolve completely. Starting at 37 weeks, begin with 500mg  capsules 2 times per day. At 38 weeks, can increase the dose to  3-4 times per day (total of 2000mg  per day)         Walking: High-energy, non-strenouous activity like walking, low-impact cardio during the day and calming activity at night like tub bath or massage.        Your pregnancy at 40 weeks     Your baby has reached his/her due date, and labor may happen at any time. Did you know that the bones in your baby's skull are flexible, allowing for navigation through the birth canal?    Do your fetal movement counts once per day each day.  If you have any concerns about your baby's movements, or if it is taking longer than one hour to get your movement counts, call us right away.    Call or go to Labor and Delivery if you are having any of the following:    1.  Leaking fluid or think that you have broken your water,  2.  Vaginal bleeding,  3.  Painful contractions occurring every 5 minutes for 2 hours or more (every 10 minutes for one hour if you have delivered a baby before).    If you do not go into labor naturally OR if there is a medical indication, we will discuss induction of labor.    Going past your due date and labor induction - ACOG FAQ's    What is post term pregnancy?    A  post term pregnancy is one that lasts 42 weeks or longer. Women who are having a baby for the first time or who have had post term pregnancies before may give birth later than expected.     What are the risks associated with post term pregnancy?    After 42 weeks, your placenta may not work as well as it did earlier in pregnancy. Also, as the baby grows, the amount of amniotic fluid may begin to decrease. Less fluid may cause the umbilical cord to become pinched as the baby moves or as your uterus contracts.  If pregnancy goes past 42 weeks, a baby has an increased risk of certain problems, such as dysmaturity syndrome, macrosomia, or meconium aspiration. There also is an increased chance of cesarean delivery.     What tests can be performed in cases of post term pregnancy?  When a baby is not  born by the due date, tests can help the health care provider check on the baby's health. Some tests, such as a kick count, can be done on your own at home. A kick count is a record of how often you feel your baby move. Others are done in the health care provider's office or in the hospital. These tests involve electronic fetal monitoring and include the non-stress test, biophysical profile, and contraction stress test.  We will do a Non-Stress test at 41 weeks if you have not delivered your baby.    What is labor induction?    Labor induction is the use of medication or other methods to bring on labor. Labor is induced to cause a pregnant woman's cervix to open and to prepare for vaginal birth.   We may recommend this if you are overdue or if your placenta is showing any signs of deterioration.    CONTACT INFORMATION  Please call the clinic M-F during business hours for questions or concerns    [facility] Capuchin [facility]   [phone]  [facility]  [phone]  [facility]  [phone]    For after hours urgent questions or labor concerns      For MD patients delivering at [facility]   Call Labor and Delivery at   [phone]  OR  Page the Doctor on call for [facility]  [phone]    For midwife patients delivering at [facility]  Call [phone]    For MD or CNM patients delivering at [facility]  Call Labor and Delivery at  [phone]  OR  Call [phone] for the [facility] Group Doctor on call  Your pregnancy at 41 weeks    Do your fetal movement counts once per day each day.  If you have any concerns about your baby's movements, or if it is taking longer than one hour to get your movement counts, call us right away.    Call or go to Labor and Delivery if you are having any of the following:    1.  Leaking fluid or think that you have broken your water,  2.  Vaginal bleeding,  3.   Painful contractions occurring every 5 minutes for 2 hours or more (every 10 minutes for one hour if you have delivered a baby before).    Labor induction    Methods used to induce labor include:  Ripening or dilating the cervix-Prostaglandins may be used to soften your cervix and to  cause your uterus to contract. Special devices can be used to dilate your cervix (foley balloon).  Stripping or sweeping the amniotic membranes-Your health care provider sweeps a finger over the thin membranes that connect the amniotic sac to the wall of your uterus. Women who have this procedure are more likely to have contractions and may go into labor within 48 hours.  Rupturing the amniotic sac-Your health care provider makes a small hole in the amniotic sac to release the fluid ("breaking the water"). Most women go into labor within hours of their water breaking.  Using oxytocin-This hormone, given through an intravenous (IV) tube in your arm, causes your uterus to contract.    Labor Induction Expectations:    1.  You may receive a cervical softening medication (prostaglandin) or foley bulb to start the induction.  This has been shown to reduce the length of labor.  2.  After cervical softening, pitocin, the natural labor hormone, will be given through an IV line. The amount of pitocin will be carefully regulated to mimic natural labor rhythms..   3.  You can ask for pain relief (intravenous medication or epidural) at any time.   4. Your labor will continue until one of the following happens:    -- your baby is born.   -- your baby can't tolerate labor. We monitor your baby's heart rate and watch for  signs of reduced oxygen to your baby. We aim to intervene well before a  significant reduction in oxygen delivery to your baby occurs.    -- your cervix stops dilating: More than several hours pass with strong  contractions without progress after 6 cm. At that point, cesarean section will be  discussed with you.    -- you have been  pushing for 3 hours or more and your baby has not been born  At that point, we will discuss forceps or vacuum assisted deilvery, or cesarean  delivery with you.    -- your cervix doesn't dilate to at least 4-6 cm after 24 hours of labor. At that point  we will discuss stopping induction and resting, applying more cervical softening techniques, or performing cesarean.    -- other reasons to not continue labor include heavy vaginal bleeding, abnormally  high or low blood pressure, breech or malpositioned baby.   5.  At all times, our nursing and physician staff or midwives are ready to support you and explain the plans in labor.   6.  Our goal is a healthy mother and healthy baby!     CONTACT INFORMATION  Please call the clinic M-F during business hours for questions or concerns    Charolotte Capuchin [facility]   [phone]  [facility]  [phone]  [facility]  [phone]    For after hours urgent questions or labor concerns      For MD patients delivering at [facility]   Call Labor and Delivery at   [phone]  OR  Page the Doctor on call for [facility]  [phone]    For midwife patients delivering at [facility]  Call [phone]    For MD or CNM patients delivering at [facility]  Call Labor and Delivery at  [phone]  OR  Call [phone] for the [facility] Group Doctor on call

## 2015-11-02 NOTE — Progress Notes (Signed)
11/02/2015 1:16 PM    Lauren Booth 30 year old female [id number]  G2P0010 5920w5d    S: Here with her Mom today. Has ANT scheduled after visit today. Denies leaking of fluids, vaginal bleeding, [place]'s, headaches, blurry vision or RUQ pain. She reports reassuring FM daily. Requesting vaginal exam today. Hoping to avoid induction.    O: see flowsheet  BP 122/74 (BP Patient Position: Sitting, BP cuff size: Regular)  Pulse 92  Temp 98 F (36.7 C) (Oral)  Ht 5\' 5"  (1.651 m)  Wt 71.4 kg (157 lb 6.4 oz)  LMP 01/21/2015 (Exact Date)  BMI 26.19 kg/m2   VE: 1-2/60/-2/mid/vertex/IBOW    A: 30 year old G2P0010 @ 8720w5d, TWG 36#  GBS negative  S=D, +FHTs by doppler  Ripening cervix    P: Chart reviewed. Updated pregnancy checklist  Antenatal testing today  Discussed IOL if pregnant at 42+0. Reviewed the risks of continuing pregnancy after 42+0 including increased maternal/fetal hemorrhage, injury, morbidity, and/or mortality during labor.  Plans to have VE and membrane sweep on Friday 9/29 and again Monday Oct 2nd. Will schedule PDI for next week.     RV: weekly  Riley KillChristine Danzel Marszalek, CNM  PID# [id number]    No orders of the defined types were placed in this encounter.    Future Appointments  Date Time Provider Department Center   11/02/2015 1:45 PM Hcnonstr MOS [facility] MOS   11/05/2015 11:00 AM Hcnonstr MOS [facility] MOS   11/09/2015 1:00 PM Geralyn FlashHirsch, Jennifer S, CNM MOS [facility] MOS   11/09/2015 1:45 PM Hcnonstr MOS [facility] MOS   11/16/2015 1:00 PM Doria Fern A, CNM MOS [facility] MOS   12/21/2015 10:00 AM Timpe, Beth K, CNM MOS [facility] MOS

## 2015-11-02 NOTE — Procedures (Signed)
ANTENATAL BIOPHYSICAL TESTING   Demographics:  Date: November 02, 2015   Patient Name: Era Skeenlejandra Cortes-Espino   Medical Record #: [id number]   DOB: 01/27/1986  Age: 30 year old  Sex: female  GA: 1339w5d   Obstetric History    G2   P0   T0   P0   A1   L0     SAB1   TAB0   Ectopic0   Multiple0   Live Births0      LMP 01/21/2015 (Exact Date)    Clinic Location:  [facility]  [facility]  [address]  [place] [id number]  Requested by:      No Pcp, Per Patient    Encounter Diagnoses   Name Primary?   . Post term pregnancy over 40 weeks Yes     Fetal Kick Counts: done - > 10 per hour  Tobacco History:   History   Smoking Status   . Never Smoker   Smokeless Tobacco   . Never Used     Medications:   Current Outpatient Prescriptions   Medication Sig   . acetaminophen (TYLENOL) 500 MG tablet Take 1 tablet (500 mg) by mouth every 6 hours as needed for Mild Pain (Pain Score 1-3).   . hydrocortisone 1 % cream Apply 1 Application topically 2 times daily. Apply to affected area   . loratadine (CLARITIN) 10 MG tablet Take 10 mg by mouth daily.   . Prenatal MV-Min-Fe Fum-FA-DHA (PRENATAL 1 PO)      No current facility-administered medications for this visit.        Amniotic Fluid Index        5.00         2.84         3.96         2.32     Total: 14.1 cm    ULTRASOUND DOCUMENTATION  Fetus: single  Presentation: vertex  Placenta: anterior  Fetal cardiac activity noted by mother: yes    Nonstress Test: Reactive: Accels > or =  to 15 BPM x 15 secs x 2  Baseline: 125  Variability: Moderate 6 - 25 bpm  Contractions: q6-7 min cont  Time on: 1358     Time Off: 1424    Handouts given: AVS/Patient Education  Patient Education: NST, AFI and FKC    To Labor and Delivery for further evaluation:no    Assessment: strip reviewed by Dr Hyacinth MeekerMiller    Plan: Repeat NST/AFI:2x per week    Larina EarthlyVicki Shepperd-Chin, RN, RN

## 2015-11-02 NOTE — Telephone Encounter (Signed)
CNM telephone triage  30 yo G2P0 @ 40+5.  Pt states she just started having ctxns q 3 mins.  She is out to eat [place] [employer].  Denies: LOF, VB.  States +FM.  Pt states in clinic today she was 1 cm (note pending).  Pt had nl APT today.  Pt easily breathes with ctxns.   Rev s/s more active labor, CNM#, calling.  Roger KillBeth Caffie Sotto, CNM [phone]

## 2015-11-02 NOTE — Telephone Encounter (Signed)
Lauren Booth is a 30 year old G2P0010 at 2363w5d c/o of labor.      Reason for call:   Chief Complaint   Patient presents with   . R/O Labor       Medical / OB History:   Patient Active Problem List   Diagnosis   . Encounter for supervision of normal first pregnancy in second trimester   . Stress due to marital problems   . Hx of breast implants, bilateral   . Chlamydia   . Rubella non-immune status, antepartum   . Hemorrhoids during pregnancy   . Preterm labor in third trimester without delivery   . Screening for cervical cancer   . Vaginal leukorrhea       Previous C/S: no    Rupture of Membrane: no    Vaginal Bleeding: no    Fetal Movement:Active    Uterine Contractions: Yes: [place] started at 1600, are 3 minutes apart and are lasting 60 seconds.    Discussed with provider: no. Plan to have E.Vu, CNM call pt back.     Advice given: Pt appears to be coping well with UCs. She desires to talk with CNM. Will pass on to CNM to call back. Advised pt to call back if CNM has not contacted her within the hour. The patient verbalized understanding and agrees to follow advice given.

## 2015-11-02 NOTE — Progress Notes (Signed)
The NST is reactive without decelerations.   The AFI is adequate.     Recommendations:   Retest in 3-4 days    Read and interpreted by: Manasa Spease B. Aaronjames Kelsay, MD

## 2015-11-02 NOTE — Patient Instructions (Signed)
One in every four patients receive a Patient Satisfaction Survey asking you to evaluate your care. Please respond and mail it back. Your opinion matters!      I hope we met your medical and personal needs today during your visit with us. Please do not leave today until we have met and exceeded your expectations. The  nurses of [facility]  value you as a patient greatly! If any questions or concerns arise, or you need to change an appointment, please contact us @ [phone] or [phone].      FETAL MOVEMENT COUNTING INSTRUCTIONS  - Fetal movement counting reduces the risk of stillbirth.  - Mothers who do fetal movement counting using the Fetal Movement Record have a lower risk of stillbirth.    1.  Sit somewhere quietly where you can pay attention to your baby's fetal movement.  2.  Record the first fetal movement and write the time on the Fetal Movement Record.  3.  Count 10 moves and stop.  Record the time on the Fetal Movement Record.    4.  This time must be less than 1 hour or call Labor and Delivery ([phone] [place] or [phone] [facility]) and they will instruct you what to do.    5. You can choose times of the day when the baby is most active to do the fetal movement counts but you must get 10 movements in less than an hour at least one time each day.  6.  Do not go to bed without doing a fetal movement count sometime during the day - if you have not been able to get 10 movements in less than an hour during the day, do not go to bed.  Call Labor and Delivery and report this to them.  They will instruct you what to do.  7.  At any time you sense a significant decrease in the baby's movement, you can do a fetal movement count and if you fail to get 10 kicks in an hour, you can call Labor and Delivery for instructions.  8.  If the Movement Time for 10 movements occurs in less than 60 minutes, you can be reassured that your baby is doing well.      FLUID INSTRUCTIONS    Drink at least 8 glasses  of water every day.     Rest on your side several times a day if possible to maximize placental blood flow.    Notify doctor or Labor and Delivery for decreased fetal movement.     PREECLAMPSIA INSTRUCTIONS    Watch for symptoms of increasing high blood pressure - headache not relieved after taking Tylenol, blurry or spotty vision, or severe abdominal pain.    Notify your doctor or Labor and Delivery ([phone] [place] or [phone] [facility]) if you have any of these symptoms.     LABOR INSTRUCTIONS:    Call your doctor or call or go to Labor and Delivery ([phone] [place] or [phone] [facility]) if you are having any of the following:    1.  Leaking fluid that is not urine or your usual vaginal discharge.  2.  Vaginal bleeding.  3.  Contractions that are uncomfortable or painful occuring every 5 minutes for 1 hour.  4.  Concern that your baby is not moving normally, or taking longer than 1 hour to get 10  fetal movement counts.  5.  Headache not relieved 2 hours after taking Tylenol, eating something and drinking something with caffeine. Call   if you experience blurry or spotty vision, or severe abdominal pain.

## 2015-11-03 ENCOUNTER — Ambulatory Visit
Admission: AD | Admit: 2015-11-03 | Discharge: 2015-11-03 | Disposition: A | Discharge status: Home / Self Care | Payer: Medicaid Other | Attending: Obstetrics & Gynecology | Admitting: Obstetrics & Gynecology

## 2015-11-03 DIAGNOSIS — O9989 Other specified diseases and conditions complicating pregnancy, childbirth and the puerperium: Secondary | ICD-10-CM | POA: Insufficient documentation

## 2015-11-03 DIAGNOSIS — Z3402 Encounter for supervision of normal first pregnancy, second trimester: Secondary | ICD-10-CM | POA: Insufficient documentation

## 2015-11-03 DIAGNOSIS — Z3A40 40 weeks gestation of pregnancy: Secondary | ICD-10-CM

## 2015-11-03 DIAGNOSIS — N898 Other specified noninflammatory disorders of vagina: Secondary | ICD-10-CM | POA: Insufficient documentation

## 2015-11-03 DIAGNOSIS — A749 Chlamydial infection, unspecified: Secondary | ICD-10-CM | POA: Insufficient documentation

## 2015-11-03 DIAGNOSIS — Z9882 Breast implant status: Secondary | ICD-10-CM | POA: Insufficient documentation

## 2015-11-03 DIAGNOSIS — Z283 Underimmunization status: Secondary | ICD-10-CM | POA: Insufficient documentation

## 2015-11-03 DIAGNOSIS — O2243 Hemorrhoids in pregnancy, third trimester: Secondary | ICD-10-CM | POA: Insufficient documentation

## 2015-11-03 DIAGNOSIS — Z630 Problems in relationship with spouse or partner: Secondary | ICD-10-CM | POA: Insufficient documentation

## 2015-11-03 DIAGNOSIS — Z2839 Other underimmunization status: Secondary | ICD-10-CM

## 2015-11-03 DIAGNOSIS — O479 False labor, unspecified: Secondary | ICD-10-CM | POA: Diagnosis present

## 2015-11-03 LAB — HEMOGRAM, BLOOD
Hct: 39.5 % (ref 34.0–45.0)
Hgb: 13.8 gm/dL (ref 11.2–15.7)
MCH: 30.1 pg (ref 26.0–32.0)
MCHC: 34.9 g/dL (ref 32.0–36.0)
MCV: 86.1 um3 (ref 79.0–95.0)
MPV: 12.2 fL (ref 9.4–12.4)
Plt Count: 177 10*3/uL (ref 140–370)
RBC: 4.59 10*6/uL (ref 3.90–5.20)
RDW: 13.5 % (ref 12.0–14.0)
WBC: 17.4 10*3/uL — ABNORMAL HIGH (ref 4.0–10.0)

## 2015-11-03 MED ORDER — MORPHINE SULFATE 10 MG/ML IJ SOLN
5.0000 mg | Freq: Once | INTRAMUSCULAR | Status: DC
Start: 2015-11-03 — End: 2015-11-03

## 2015-11-03 MED ORDER — SODIUM CHLORIDE 0.9 % IV SOLN
12.50 mg | Freq: Once | INTRAVENOUS | Status: AC
Start: 2015-11-03 — End: 2015-11-03
  Administered 2015-11-03: 12.5 mg via INTRAVENOUS
  Filled 2015-11-03: qty 0.5

## 2015-11-03 MED ORDER — MORPHINE SULFATE 10 MG/ML IJ SOLN
10.00 mg | Freq: Once | INTRAMUSCULAR | Status: AC
Start: 2015-11-03 — End: 2015-11-03
  Administered 2015-11-03: 10 mg via INTRAMUSCULAR
  Filled 2015-11-03: qty 1

## 2015-11-03 MED ORDER — MORPHINE SULFATE 10 MG/ML IJ SOLN
10.0000 mg | Freq: Once | INTRAMUSCULAR | Status: DC
Start: 2015-11-03 — End: 2015-11-03

## 2015-11-03 MED ORDER — LACTATED RINGERS IV SOLN
INTRAVENOUS | Status: DC
Start: 2015-11-03 — End: 2015-11-03
  Administered 2015-11-03: 02:00:00 via INTRAVENOUS

## 2015-11-03 MED ORDER — MORPHINE SULFATE 10 MG/ML IJ SOLN
5.00 mg | Freq: Once | INTRAMUSCULAR | Status: AC
Start: 2015-11-03 — End: 2015-11-03
  Administered 2015-11-03: 5 mg via INTRAVENOUS
  Filled 2015-11-03: qty 1

## 2015-11-03 NOTE — Interdisciplinary (Signed)
Lauren Booth is a 30 year old G2P0010 at 2758w6d presenting to labor & delivery complaining of labor.    Medical / OB History:   Patient Active Problem List   Diagnosis   . Encounter for supervision of normal first pregnancy in second trimester   . Stress due to marital problems   . Hx of breast implants, bilateral   . Chlamydia   . Rubella non-immune status, antepartum   . Hemorrhoids during pregnancy   . Preterm labor in third trimester without delivery   . Screening for cervical cancer   . Vaginal leukorrhea   . Irregular uterine contractions       Chief Compliant:  Chief Complaint   Patient presents with   . R/O Labor       Uterine Contractions: Yes: [name] started in the AM and are now q2-3 minutes apart and are lasting 60-90 seconds.    Rupture of Membrane: no    Vaginal Bleeding: no    Fetal Movement:Active    Previous C/S: no    Pre-eclampsia symptoms: no    Urinary tract infection symptoms: no    Side rails up x2. Call light in reach. Patient awaiting evaluation by provider.    [name], RCharity fundraiser

## 2015-11-03 NOTE — Interdisciplinary (Signed)
Discharge instructions given to pt. Labor precautions, Kick count instructions, PET precautions and when to call CNM or provider reviewed with pt. Has appointment at clinic tomorrow. Pt v/u and signed discharged form. Pt discharged home with supportive family.

## 2015-11-03 NOTE — Interdisciplinary (Signed)
Report from Chelsea Witwer, RN. Care assumed.

## 2015-11-03 NOTE — Progress Notes (Addendum)
Labor and Delivery Triage Assessment      History of Present Illness:  Lauren Booth [id number] is a 30 year old G2P0010 at 5828w6d EGA who presents for a chief complaint of contractions every 3 min for last several hours. Patient admits to being very tired and " I dont know how to do this all night. "  Per patient report her exam at clinic today was 1cm/60%    Symptom(s) location:     Leaking of fluid: No  Contractions: Yes - q3 min  Pre-eclampsia symptoms: No  Bleeding: No but reports her " mucous plug came out a few hours ago"  Dysuria: No  Normal fetal movement: yes    Comprehensive PMFSH (past medical family social history) which was performed during a previous encounter was re-examined and reviewed with the patient.  There is nothing new to add today.     Allergies:  No Known Allergies    Review of Systems:   A comprehensive review of systems was negative except for: contractions    Vitals:  BP 121/80 (BP Patient Position: Sitting)  Pulse 82  Resp 17  LMP 01/21/2015 (Exact Date)      General appearance: in no apparent distress and well developed and well nourished  Heart: regular rate and rhythm  Lungs:clear to auscultation   Abdomen: Abdomen soft, non-tender. BS normal. No masses, organomegaly  Extremities: No edema  Neuro: neg clonus, 2+ DTRs  Pelvic: Normal external genitalia and Bartholin's glands, urethra, Skene's glands negative, SVE: 1.5cm / 80% / -3 station / mid-posterior position / medium consistency    FHR: baseline 150 baseline with increases noted above with FM and no decreases noted during or after [place]  Uterine Activity: 3 min x 60 sec, mild per palpation for most, one felt moderate in 30 min.     Assessment/Plan:  Lauren Booth is a 30 year old G2P0010 presents for contractions  1. Reviewed normal labor process and reassured patient she is making normal ripening cervical change.   2. Fetal Well-being: reassuring fhts  3. Reviewed options to discharge home, vs ambulate x2 hrs vs  Therapeutic rest. Reviewed risks/benefits of all 3 options, patient desires for therapeutic rest. IV LR, Cbc and ob hold ordered. Morphine 5mg  IV, 10mg  IM, phenergan  4. Dispo: reassess  after patient awake after therapeutic rest      Future Appointments  Date Time Provider Department Center   11/05/2015 8:45 AM Ellison Hughsimpe, Beth K, CNM [facility]   11/05/2015 10:15 AM [facility]   11/09/2015 1:00 PM Geralyn FlashHirsch, Jennifer S, CNM [facility]   11/09/2015 1:45 PM [facility]   11/16/2015 1:00 PM Lewie ChamberCortes, Christine A, CNM [facility]   12/21/2015 10:00 AM Timpe, Beth K, CNM [facility]       11/03/2015   1:01 AM     Erica T. Vu    Addendum: 1191: 0337   Patient assisted up to void. She has been able to rest for most of the last hour. Feels some random cramping but less intense. S/p morphine and phenergan within the last hour. Will continue to have patient rest and reassess when more awake.   AdelantoErica Vu, [place] [phone]      Addendum: [id number]  Patient has been able to rest for the last 3 hours. Feels better. Desires VE and membrane sweep to see if that progresses her labor before going home. Supportive mother and sister at bedside. VE 2/80/-2 swept done.  FHTs 135 without decreases. Patient IV lR infused already. Patient has voided twice since medicated. Plan to reassess in 1-2 hrs and likely discharge home if not progressing towards active labor.  Patient agreeable. Report given to P.ward, CNM for further patient care.   Verne Spurr, [place] [id number]    Assumption of care from Verne Spurr CNM  Patient is relaxed, talking with family. Feels like contractions might be resuming. Agree with plan per Verne Spurr CNM to discharge home since not progressing towards active labor at this time s/p therapeutic rest. Patient also in agreement to go home at this time. Advise to drink 4-8 oz water and electrolyte fluids and try to urinate hourly. Encouraged to change positions and use hydrotherapy prn. Pt to call back for fetal concerns, leaking of fluid,  vaginal bleeding, pressure, increase in [place] frequency/intensity or decreased FM. May come in for evaluation at any time if worried or feels uncomfortable staying home. Ok to take Benadryl 25-50 mg when home. Epsom salt bath with lavender.  Reviewed  labor instructions, warning sx, FKC and CNM# f/U CNM on call/  Keep f/u antenatal testing and CNM appointments as scheduled.  Pam Ward CNM [phone]

## 2015-11-03 NOTE — Discharge Instructions (Signed)
Obstetrical Outpatient Discharge Instructions:    Labor & Delivery Location:  [facility] Labor and Delivery  [address]  [place], [place] [address]  [phone]                        Based on the medical evaluation completed by our staff, it has been determined that you are NOT IN NEED OF EMERGENCY OBSTETRICAL SERVICES AT THIS TIME.     If and when any of the symptoms noted below occur, you are advised to go to the hospital closest to your home that provides obstetrical services or to the hospital that you and your health care provider have agreed upon.    Please follow the instructions for:    LABOR PRECAUTIONS    1. Contractions every 5 minutes or less.  2. Temperature higher than 100.5 F.  3. Vaginal bleeding  4. Ruptured membranes (bag of water broken).  5. Decreased fetal movement        FETAL KICK COUNTS    1. Count the baby’s movement every night.  2. A movement may be a kick, swish or roll. Do not count hiccups or small flutters.  3. Count baby’s movements while lying down, preferably on your left side,  preferably after a meal.  4. Mark down the time you feel the baby move for the first time.  5. Mark down the time you feel the tenth fetal movement.  6. You should feel at least 10 fetal movements within one hour.  7. Call Labor and Delivery immediately if:  a. You do not feel 10 movements within one hour  b. It takes longer and longer for your baby to move 10 times  c. You have not felt your baby move all day.        PREGNANCY INDUCED HYPERTENSION    1. Headache: sudden or severe.  2. Visual problems: blurred or double vision or “seeing spots”  3. Pain under your ribs, right over your stomach  4. Swelling of your face and hands  5. Swelling of your ankles or feet after 12-hour rest  6. Decrease in the amount of your urine  7. Rapid weight gain: 4 or 5 pounds in one week.

## 2015-11-04 ENCOUNTER — Telehealth (INDEPENDENT_AMBULATORY_CARE_PROVIDER_SITE_OTHER): Payer: Self-pay | Admitting: Advanced Practice Midwife

## 2015-11-04 NOTE — Telephone Encounter (Signed)
CNM Telephone Note  Patient is a 30 y/o G2P0 @ 3731w0d with pregnancy c/b   Patient Active Problem List   Diagnosis   . Encounter for supervision of normal first pregnancy in second trimester   . Stress due to marital problems   . Hx of breast implants, bilateral   . Chlamydia   . Rubella non-immune status, antepartum   . Hemorrhoids during pregnancy   . Preterm labor in third trimester without delivery   . Screening for cervical cancer   . Vaginal leukorrhea   . Irregular uterine contractions     Patient calling to report that she has had contractions every 8 minutes, unable to sleep and feeling frustrated. Pt denies LOF, Vaginal bleeding. Denies headaches, visual changes and right upper quadrant pain. Reports + FM.    Advised to take Benadryl 50 mg and to drink 4-8 oz water and electrolyte fluids and try to urinate hourly while awake. Encouraged to change positions and use hydrotherapy prn. Pt to call back for fetal concerns, leaking of fluid, vaginal bleeding, pressure, increase in [place] frequency/intensity or decreased FM. May come in for evaluation at any time if worried or feels uncomfortable staying home. Epsom salt bath with lavender.  Follow up: CNM on call for labor or PNV as scheduled.   Pam Ward CNM [phone]

## 2015-11-05 ENCOUNTER — Ambulatory Visit (HOSPITAL_COMMUNITY): Payer: Medicaid Other | Admitting: Anesthesiology

## 2015-11-05 ENCOUNTER — Inpatient Hospital Stay
Admission: AD | Admit: 2015-11-05 | Discharge: 2015-11-05 | DRG: 782 | Discharge status: Against Medical Advice | Payer: Medicaid Other | Attending: Obstetrics & Gynecology | Admitting: Obstetrics & Gynecology

## 2015-11-05 ENCOUNTER — Encounter (HOSPITAL_COMMUNITY): Payer: Self-pay | Admitting: Certified Registered Nurse Anesthetist

## 2015-11-05 ENCOUNTER — Ambulatory Visit: Payer: Medicaid Other | Attending: Obstetrics & Gynecology

## 2015-11-05 ENCOUNTER — Ambulatory Visit (HOSPITAL_BASED_OUTPATIENT_CLINIC_OR_DEPARTMENT_OTHER): Payer: Medicaid Other | Admitting: Advanced Practice Midwife

## 2015-11-05 ENCOUNTER — Inpatient Hospital Stay
Admission: AD | Admit: 2015-11-05 | Discharge: 2015-11-10 | DRG: 765 | Disposition: A | Discharge status: Home / Self Care | Payer: Medicaid Other | Attending: Obstetrics & Gynecology | Admitting: Obstetrics & Gynecology

## 2015-11-05 DIAGNOSIS — O2243 Hemorrhoids in pregnancy, third trimester: Secondary | ICD-10-CM

## 2015-11-05 DIAGNOSIS — O480 Post-term pregnancy: Secondary | ICD-10-CM | POA: Insufficient documentation

## 2015-11-05 DIAGNOSIS — O2240 Hemorrhoids in pregnancy, unspecified trimester: Secondary | ICD-10-CM

## 2015-11-05 DIAGNOSIS — O479 False labor, unspecified: Secondary | ICD-10-CM

## 2015-11-05 DIAGNOSIS — Z3A41 41 weeks gestation of pregnancy: Secondary | ICD-10-CM

## 2015-11-05 DIAGNOSIS — Z630 Problems in relationship with spouse or partner: Secondary | ICD-10-CM

## 2015-11-05 DIAGNOSIS — O9989 Other specified diseases and conditions complicating pregnancy, childbirth and the puerperium: Secondary | ICD-10-CM

## 2015-11-05 DIAGNOSIS — Z9882 Breast implant status: Secondary | ICD-10-CM | POA: Insufficient documentation

## 2015-11-05 DIAGNOSIS — Z283 Underimmunization status: Secondary | ICD-10-CM | POA: Insufficient documentation

## 2015-11-05 DIAGNOSIS — Z3402 Encounter for supervision of normal first pregnancy, second trimester: Secondary | ICD-10-CM

## 2015-11-05 DIAGNOSIS — A749 Chlamydial infection, unspecified: Secondary | ICD-10-CM

## 2015-11-05 DIAGNOSIS — Z8249 Family history of ischemic heart disease and other diseases of the circulatory system: Secondary | ICD-10-CM

## 2015-11-05 DIAGNOSIS — Z823 Family history of stroke: Secondary | ICD-10-CM

## 2015-11-05 DIAGNOSIS — Z818 Family history of other mental and behavioral disorders: Secondary | ICD-10-CM

## 2015-11-05 DIAGNOSIS — Z23 Encounter for immunization: Secondary | ICD-10-CM

## 2015-11-05 DIAGNOSIS — O1414 Severe pre-eclampsia complicating childbirth: Secondary | ICD-10-CM

## 2015-11-05 DIAGNOSIS — Z98891 History of uterine scar from previous surgery: Secondary | ICD-10-CM

## 2015-11-05 DIAGNOSIS — O368390 Maternal care for abnormalities of the fetal heart rate or rhythm, unspecified trimester, not applicable or unspecified: Secondary | ICD-10-CM

## 2015-11-05 DIAGNOSIS — O09899 Supervision of other high risk pregnancies, unspecified trimester: Secondary | ICD-10-CM

## 2015-11-05 DIAGNOSIS — Z3A39 39 weeks gestation of pregnancy: Secondary | ICD-10-CM

## 2015-11-05 DIAGNOSIS — Z2839 Other underimmunization status: Secondary | ICD-10-CM

## 2015-11-05 DIAGNOSIS — N898 Other specified noninflammatory disorders of vagina: Secondary | ICD-10-CM

## 2015-11-05 DIAGNOSIS — O09893 Supervision of other high risk pregnancies, third trimester: Principal | ICD-10-CM | POA: Insufficient documentation

## 2015-11-05 DIAGNOSIS — O1204 Gestational edema, complicating childbirth: Secondary | ICD-10-CM | POA: Diagnosis not present

## 2015-11-05 DIAGNOSIS — Z608 Other problems related to social environment: Secondary | ICD-10-CM | POA: Diagnosis present

## 2015-11-05 LAB — TYPE & SCREEN
ABO/RH: O POS
ABO/RH: O POS
Antibody Screen: NEGATIVE
Antibody Screen: NEGATIVE

## 2015-11-05 LAB — HEMOGRAM, BLOOD
Hct: 40 % (ref 34.0–45.0)
Hct: 39.2 % (ref 34.0–45.0)
Hgb: 14.1 gm/dL (ref 11.2–15.7)
Hgb: 13.5 gm/dL (ref 11.2–15.7)
MCH: 30.9 pg (ref 26.0–32.0)
MCH: 30 pg (ref 26.0–32.0)
MCHC: 35.3 g/dL (ref 32.0–36.0)
MCHC: 34.4 g/dL (ref 32.0–36.0)
MCV: 87.5 um3 (ref 79.0–95.0)
MCV: 87.1 um3 (ref 79.0–95.0)
MPV: 12.3 fL (ref 9.4–12.4)
MPV: 12.5 fL — ABNORMAL HIGH (ref 9.4–12.4)
Plt Count: 170 10*3/uL (ref 140–370)
Plt Count: 174 10*3/uL (ref 140–370)
RBC: 4.57 10*6/uL (ref 3.90–5.20)
RBC: 4.5 10*6/uL (ref 3.90–5.20)
RDW: 14 % (ref 12.0–14.0)
RDW: 13.6 % (ref 12.0–14.0)
WBC: 13.9 10*3/uL — ABNORMAL HIGH (ref 4.0–10.0)
WBC: 12.9 10*3/uL — ABNORMAL HIGH (ref 4.0–10.0)

## 2015-11-05 MED ORDER — ACETAMINOPHEN 325 MG PO TABS
650.0000 mg | ORAL_TABLET | Freq: Four times a day (QID) | ORAL | Status: DC | PRN
Start: 2015-11-05 — End: 2015-11-06

## 2015-11-05 MED ORDER — LIDOCAINE HCL 1 % IJ SOLN
20.0000 mL | Freq: Once | INTRAMUSCULAR | Status: DC | PRN
Start: 2015-11-05 — End: 2015-11-05

## 2015-11-05 MED ORDER — ONDANSETRON HCL 4 MG/2ML IV SOLN
INTRAMUSCULAR | Status: AC
Start: 2015-11-05 — End: 2015-11-05
  Administered 2015-11-05: 4 mg via INTRAVENOUS
  Filled 2015-11-05: qty 2

## 2015-11-05 MED ORDER — SODIUM CHLORIDE 0.9 % IJ SOLN (CUSTOM)
3.0000 mL | INTRAMUSCULAR | Status: DC | PRN
Start: 2015-11-05 — End: 2015-11-05

## 2015-11-05 MED ORDER — BUPIVACAINE DILUTION 0.125% IJ SOLN (ANESTHESIA)
INTRAMUSCULAR | Status: DC | PRN
Start: 2015-11-05 — End: 2015-11-06
  Administered 2015-11-05: 10 mL via EPIDURAL

## 2015-11-05 MED ORDER — CARBOPROST TROMETHAMINE 250 MCG/ML IM SOLN
250.0000 ug | Freq: Once | INTRAMUSCULAR | Status: DC | PRN
Start: 2015-11-05 — End: 2015-11-05

## 2015-11-05 MED ORDER — ACETAMINOPHEN 325 MG PO TABS
650.0000 mg | ORAL_TABLET | Freq: Four times a day (QID) | ORAL | Status: DC | PRN
Start: 2015-11-05 — End: 2015-11-05

## 2015-11-05 MED ORDER — METHYLERGONOVINE MALEATE 0.2 MG/ML IJ SOLN
0.2000 mg | Freq: Once | INTRAMUSCULAR | Status: DC | PRN
Start: 2015-11-05 — End: 2015-11-06
  Filled 2015-11-05: qty 1

## 2015-11-05 MED ORDER — SODIUM CHLORIDE 0.9 % IJ SOLN (CUSTOM)
3.0000 mL | Freq: Three times a day (TID) | INTRAMUSCULAR | Status: DC
Start: 2015-11-05 — End: 2015-11-05

## 2015-11-05 MED ORDER — SOD CITRATE-CITRIC ACID 500-334 MG/5ML OR SOLN
30.0000 mL | Freq: Once | ORAL | Status: DC | PRN
Start: 2015-11-05 — End: 2015-11-05

## 2015-11-05 MED ORDER — LIDOCAINE HCL 1 % IJ SOLN
1.0000 mL | Freq: Once | INTRAMUSCULAR | Status: DC | PRN
Start: 2015-11-05 — End: 2015-11-05

## 2015-11-05 MED ORDER — OXYTOCIN INFUSION 40 UNITS/1000 ML NS (PREMIX)
Freq: Once | Status: DC | PRN
Start: 2015-11-05 — End: 2015-11-06

## 2015-11-05 MED ORDER — TERBUTALINE SULFATE 1 MG/ML IJ SOLN
0.2500 mg | INTRAMUSCULAR | Status: DC | PRN
Start: 2015-11-05 — End: 2015-11-05

## 2015-11-05 MED ORDER — SODIUM CHLORIDE 0.9% TKO INFUSION
INTRAVENOUS | Status: DC | PRN
Start: 2015-11-05 — End: 2015-11-05

## 2015-11-05 MED ORDER — FENTANYL 2 MCG/ML-BUPIVACAINE 0.125% IN NACL EPIDURAL (PREMADE)
Status: DC
Start: 2015-11-05 — End: 2015-11-08
  Administered 2015-11-05: 10 mL/h via EPIDURAL
  Filled 2015-11-05: qty 250

## 2015-11-05 MED ORDER — OXYTOCIN INFUSION 10 UNITS/500 ML LR
0.5000 m[IU]/min | Status: DC
Start: 2015-11-05 — End: 2015-11-05
  Filled 2015-11-05: qty 500

## 2015-11-05 MED ORDER — LIDOCAINE HCL 1 % IJ SOLN
20.0000 mL | Freq: Once | INTRAMUSCULAR | Status: DC | PRN
Start: 2015-11-05 — End: 2015-11-06

## 2015-11-05 MED ORDER — METHYLERGONOVINE MALEATE 0.2 MG/ML IJ SOLN
0.2000 mg | Freq: Once | INTRAMUSCULAR | Status: DC | PRN
Start: 2015-11-05 — End: 2015-11-05

## 2015-11-05 MED ORDER — LACTATED RINGERS IV SOLN
Freq: Once | INTRAVENOUS | Status: AC
Start: 2015-11-05 — End: 2015-11-06

## 2015-11-05 MED ORDER — TERBUTALINE SULFATE 1 MG/ML IJ SOLN
0.2500 mg | INTRAMUSCULAR | Status: DC | PRN
Start: 2015-11-05 — End: 2015-11-06
  Administered 2015-11-05 – 2015-11-06 (×2): 0.25 mg via SUBCUTANEOUS
  Filled 2015-11-05 (×2): qty 1

## 2015-11-05 MED ORDER — METOCLOPRAMIDE HCL 5 MG/ML IJ SOLN
10.0000 mg | Freq: Four times a day (QID) | INTRAMUSCULAR | Status: DC | PRN
Start: 2015-11-05 — End: 2015-11-06
  Administered 2015-11-05 (×2): 10 mg via INTRAVENOUS
  Filled 2015-11-05 (×2): qty 2

## 2015-11-05 MED ORDER — LACTATED RINGERS IV SOLN
INTRAVENOUS | Status: DC
Start: 2015-11-05 — End: 2015-11-05

## 2015-11-05 MED ORDER — OXYTOCIN INFUSION 20 UNITS/1000 ML LR (PREMIX)
Status: DC
Start: 2015-11-05 — End: 2015-11-06
  Administered 2015-11-06 (×5): 100 mL via INTRAVENOUS

## 2015-11-05 MED ORDER — LACTATED RINGERS IV SOLN
INTRAVENOUS | Status: DC
Start: 2015-11-05 — End: 2015-11-06
  Administered 2015-11-05 (×3): via INTRAVENOUS

## 2015-11-05 MED ORDER — MISOPROSTOL 200 MCG OR TABS
800.0000 ug | ORAL_TABLET | Freq: Once | ORAL | Status: DC | PRN
Start: 2015-11-05 — End: 2015-11-06
  Filled 2015-11-05: qty 4

## 2015-11-05 MED ORDER — SODIUM CHLORIDE 0.9% TKO INFUSION
INTRAVENOUS | Status: DC | PRN
Start: 2015-11-05 — End: 2015-11-06

## 2015-11-05 MED ORDER — SOD CITRATE-CITRIC ACID 500-334 MG/5ML OR SOLN
30.00 mL | Freq: Once | ORAL | Status: AC | PRN
Start: 2015-11-05 — End: 2015-11-06
  Administered 2015-11-06: 30 mL via ORAL
  Filled 2015-11-05: qty 30

## 2015-11-05 MED ORDER — ONDANSETRON HCL 4 MG/2ML IV SOLN
4.0000 mg | Freq: Four times a day (QID) | INTRAMUSCULAR | Status: DC | PRN
Start: 2015-11-05 — End: 2015-11-06

## 2015-11-05 MED ORDER — FENTANYL CITRATE (PF) 100 MCG/2ML IJ SOLN
100.0000 ug | INTRAMUSCULAR | Status: DC | PRN
Start: 2015-11-05 — End: 2015-11-06

## 2015-11-05 MED ORDER — CETIRIZINE HCL 10 MG OR TABS
ORAL_TABLET | ORAL | 1 refills | Status: AC
Start: 2015-11-02 — End: ?

## 2015-11-05 MED ORDER — CARBOPROST TROMETHAMINE 250 MCG/ML IM SOLN
250.0000 ug | Freq: Once | INTRAMUSCULAR | Status: DC | PRN
Start: 2015-11-05 — End: 2015-11-06
  Filled 2015-11-05: qty 1

## 2015-11-05 MED ORDER — OXYTOCIN INFUSION 20 UNITS/1000 ML LR (PREMIX)
Status: DC
Start: 2015-11-05 — End: 2015-11-05

## 2015-11-05 MED ORDER — MISOPROSTOL 200 MCG OR TABS
800.0000 ug | ORAL_TABLET | Freq: Once | ORAL | Status: DC | PRN
Start: 2015-11-05 — End: 2015-11-05

## 2015-11-05 MED ORDER — ONDANSETRON HCL 4 MG/2ML IV SOLN
4.0000 mg | Freq: Four times a day (QID) | INTRAMUSCULAR | Status: DC | PRN
Start: 2015-11-05 — End: 2015-11-05
  Administered 2015-11-05: 4 mg via INTRAVENOUS

## 2015-11-05 MED ORDER — LIDOCAINE HCL 1.5 % IJ SOLN
INTRAMUSCULAR | Status: DC | PRN
Start: 2015-11-05 — End: 2015-11-06
  Administered 2015-11-05: 3 mL via EPIDURAL

## 2015-11-05 MED ORDER — LIDOCAINE HCL 1 % IJ SOLN
1.0000 mL | Freq: Once | INTRAMUSCULAR | Status: DC | PRN
Start: 2015-11-05 — End: 2015-11-06

## 2015-11-05 MED ORDER — SODIUM CHLORIDE 0.9 % IJ SOLN (CUSTOM)
3.0000 mL | Freq: Three times a day (TID) | INTRAMUSCULAR | Status: DC
Start: 2015-11-05 — End: 2015-11-06

## 2015-11-05 MED ORDER — ONDANSETRON HCL 4 MG/2ML IV SOLN
4.00 mg | Freq: Once | INTRAMUSCULAR | Status: AC | PRN
Start: 2015-11-05 — End: 2015-11-05
  Administered 2015-11-05: 4 mg via INTRAVENOUS
  Filled 2015-11-05: qty 2

## 2015-11-05 MED ORDER — OXYTOCIN INFUSION 10 UNITS/500 ML LR
0.0000 m[IU]/min | Status: DC
Start: 2015-11-06 — End: 2015-11-06
  Administered 2015-11-05: 1 m[IU]/min via INTRAVENOUS

## 2015-11-05 MED ORDER — OXYTOCIN INFUSION 40 UNITS/1000 ML NS (PREMIX)
Freq: Once | Status: DC | PRN
Start: 2015-11-05 — End: 2015-11-05

## 2015-11-05 MED ORDER — SODIUM CHLORIDE 0.9 % IJ SOLN (CUSTOM)
3.0000 mL | INTRAMUSCULAR | Status: DC | PRN
Start: 2015-11-05 — End: 2015-11-06

## 2015-11-05 MED ORDER — FENTANYL 2 MCG/ML-BUPIVACAINE 0.125% IN NACL EPIDURAL BOLUS FROM BAG
Status: DC | PRN
Start: 2015-11-05 — End: 2015-11-08

## 2015-11-05 NOTE — Interdisciplinary (Signed)
[facility]  Pt signed AMA form.  IV removed.  Pt discharged AMA via ambulation.  [facility]

## 2015-11-05 NOTE — Anesthesia Procedure Notes (Signed)
Epidural/Spinal Block Procedure Note  Date/Time:  Date & Time: 11/05/2015 10:41 PM   Universal Protocol:  Universal Protocol: Verbal consent obtained, risks and benefits discussed, patient states understanding of the procedure being performed, the patient's understanding of the procedure matches consent given, procedure consent matches procedure scheduled, relevant documents present and verified, test results available and properly labeled, site marked, imaging studies available, required blood products, implants, devices, and special equipment available and Immediately prior to procedure a time out was called to verify the correct patient, procedure, equipment, support staff and site/side marked as required  Consent given by: patient  Patient identity confirmed by: verbally with patient, arm band and hospital-assigned identification number   Procedure:    Procedure type: Epidural block  Sterile skin prep: Chlorehexadine (Chloraprep)  Patient position: Sitting    Approach: Midline  Interspace: L3-4    Needle gauge used to anesthetize site: 25G  Needle gauge used to perform procedure: 17g  Insertion attempts: 1  no Ane Parasthesia Yes/No    no epidural CSF  Loss of resistance: 4 cm.  Catheter insertion depth: 9 cm.  No sign of the following injections: SAB and IV  Test dose: lidocaine 1.5% w/ epinephrine  no Ultrasound Guided Procedure       Comments:

## 2015-11-05 NOTE — Plan of Care (Signed)
Problem: Labor Process  Goal: Uterine contraction within specified parameters  Consider using an intrauterine pressure catheter in patients who are difficult to evaluate uterine contractions (eg, obese patients).   Outcome: Progressing toward goal, anticipate improvement over: next 12-24 hours      Problem: Maternal-Fetal Status  Goal: Maternal - fetal well being  Outcome: Progressing toward goal, anticipate improvement over: next 12-24 hours      Problem: Pain - Acute  Goal: Reduced pain sensation  Outcome: Progressing toward goal, anticipate improvement over: next 12-24 hours

## 2015-11-05 NOTE — Progress Notes (Signed)
Labor and Delivery  CNM Progress Note:    S: Lying on side using nitrous oxide. Coping very well, feeling pressure in her rectum. VE as noted below. Reviewed positions to aid in rotation, possible recommendation for AROM with next exam. Questions answered. Supportive family at side.    O:  Temperature:  [98.6 F (37 C)-98.7 F (37.1 C)] 98.7 F (37.1 C) (09/29 1835)  Blood pressure (BP): (85-117)/(50-82) 85/50 (09/29 1835)  Heart Rate:  [75-93] 75 (09/29 1835)  Respirations:  [16-18] 16 (09/29 1835)  Pain Score: 4 (09/29 0924)  O2 Device: None (Room air) (09/29 1002)  O2 Flow Rate (L/min):  [10 l/min] 10 l/min (09/29 0907)  Blood Pressure   11/05/15 (!) 85/50   11/05/15 117/82   11/05/15 110/78       FHT: 155, + accelerations, variable decelerations, moderate variability  CTX: Q 3-7 minutes   SVE: 5.5/100/-2    IMP: Darrell Cortes-Espino is a 30 year old G2P0010 at 579w1d admitted for Labor and delivery indication for care or intervention [O75.9]     FHT CAT II, overall reassuring    PLAN:   Nitrous oxide as desires  Doula-called in  Pitocin per protocol  Continue supportive care and encouragement  MD's updated on progress/FHT's    Geraldine SolarJacqueline M. Claretha CooperHenrich, CNM

## 2015-11-05 NOTE — Progress Notes (Signed)
Labor and Delivery  CNM Progress Note:    S: standing at bedside, contractions have gotten stronger, more frequent. Has been vomiting small amounts. Requests VE, wants to know her progress. VE as noted below, great progress. Agrees to try Zofran, then Reglan as needed. Aware of availability of nitrous oxide and fentanyl as pain management options. Elected to use nitrous oxide. Very supportive family remain present.     Patient Active Problem List   Diagnosis   . Encounter for supervision of normal first pregnancy in second trimester   . Stress due to marital problems   . Hx of breast implants, bilateral   . Chlamydia   . Rubella non-immune status, antepartum   . Hemorrhoids during pregnancy   . Screening for cervical cancer   . Maternal care for fetal decelerations during pregnancy   . Labor and delivery indication for care or intervention     O:  Temperature:  [98.6 F (37 C)-98.7 F (37.1 C)] 98.6 F (37 C) (09/29 1258)  Blood pressure (BP): (108-117)/(68-82) 108/68 (09/29 1258)  Heart Rate:  [83-93] 83 (09/29 1258)  Respirations:  [16-18] 16 (09/29 1258)  Pain Score: 4 (09/29 0924)  O2 Device: None (Room air) (09/29 1002)  O2 Flow Rate (L/min):  [10 l/min] 10 l/min (09/29 0907)     Lab Results   Component Value Date    WBC 12.9 11/05/2015    RBC 4.50 11/05/2015    HGB 13.5 11/05/2015    HCT 39.2 11/05/2015    MCV 87.1 11/05/2015    MCHC 34.4 11/05/2015    RDW 13.6 11/05/2015    PLT 174 11/05/2015    MPV 12.5 11/05/2015     Blood Pressure   11/05/15 108/68   11/05/15 117/82   11/05/15 110/78       FHT: 130's, + accelerations, mild, occasional decelerations, moderate variability  CTX: Q 2-3 minutes   SVE: 4.5/100/-2    IMP: Lauren Booth is a 30 year old G2P0010 at 8142w1d admitted for Labor and delivery indication for care or intervention [O75.9]     FHT CAT II, at times I, overall reassuring    PLAN:   Pain management as desires  Pitocin ordered, never started  Zofran/Reglan PRN as ordered  Continue  supportive care and encouragement  Doula as available    Geraldine SolarJacqueline M. Claretha CooperHenrich, CNM

## 2015-11-05 NOTE — Plan of Care (Signed)
Problem: Labor Process  Goal: Uterine contraction within specified parameters  Consider using an intrauterine pressure catheter in patients who are difficult to evaluate uterine contractions (eg, obese patients).   Outcome: Goal Met  Ongoing        Problem: Maternal-Fetal Status  Goal: Maternal - fetal well being  Outcome: Goal Met  Ongoing        Problem: Pain - Acute  Goal: Reduced pain sensation  Outcome: Goal Met  Ongoing, patient will request pain intervention when needed,

## 2015-11-05 NOTE — Anesthesia Preprocedure Evaluation (Signed)
ANESTHESIA PRE-OPERATIVE EVALUATION    Patient Information    Name: Lauren Booth    MRN: [id number]    DOB: 10/11/1985    Age: 30 year old    Sex: female  * No procedures listed *      BP 99/60 (BP Patient Position: Semi-Fowlers)  Pulse 83  Temp 36.7 C  Resp 16  Ht 5' 6" (1.676 m)  Wt 71.2 kg (157 lb)  LMP 01/21/2015 (Exact Date)  BMI 25.34 kg/m2   BMI kg/m2: 25.34 kg/m2    Primary language spoken:  English    ROS/Medical History:  General Review & History of Anesthetic Complications:         Cardiovascular:  - negative ROS  Exercise tolerance: >4 METS       Pulmonary:  - negative ROS  Hematology/Oncology:   - negative ROS   Neuro/Psych:   - negative ROS        Infectious Disease:            Endo/Other:   - negative ROS  GI/Hepatic:  - negative ROS    Renal:    - Negative ROS        Pregnancy History:  Para: 0  Gravida: 2         [facility] ([facility]) Notes:               Physical Exam    Airway:  Inter-inciser distance > 4 cm  Prognanth Able    Mallampati: I  Neck ROM: full  TM distance: > 6 cm  Short thick neck: No    Cardiovascular:  - cardiovascular exam normal         Pulmonary:  - pulmonary exam normal           Neuro/Neck/Skeletal/Skin:      Dental:      Abdominal:              Last  OSA (STOP BANG) Score:  No Data Recorded    Last OSA  (STOP) Score for   No Data Recorded                 Past Medical History:   Diagnosis Date   . History of chicken pox    . History of cone biopsy of cervix     done in [place]   . 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 Seasonal allergies      Past Surgical History:   Procedure Laterality Date   . breast implants  2015    Breast Augmentation in [place]   . NOSE SURGERY      due to broken nose     Social History   Substance Use Topics   . Smoking status: Never Smoker   . Smokeless tobacco: Never Used   . Alcohol use No       Current Facility-Administered Medications   Medication Dose Route Frequency Provider Last Rate Last Dose   . acetaminophen (TYLENOL) tablet 650 mg  650  mg Oral Q6H PRN Henrich, Jacqueline M., CNM       . carboprost (HEMABATE) injection 250 mcg  250 mcg IntraMUSCULAR Once PRN Henrich, Jacqueline M., CNM       . fentaNYL 2 mcg/mL-bupivacaine 0.125% epidural bolus from bag   Epidural PRN Doan, Christina Ng, MD       . fentaNYL 2 mcg/mL-bupivacaine 0.125% epidural infusion   Epidural Continuous Doan, Christina Ng, MD 10 mL/hr at 11/05/15 2239 10 mL/hr   at 11/05/15 2239   . fentaNYL injection 100 mcg  100 mcg IntraVENOUS Q1H PRN Henrich, Jacqueline M., CNM       . lactated ringers 500 mL IV bolus   IntraVENOUS Once Doan, Christina Ng, MD       . lactated ringers infusion   IntraVENOUS Continuous Henrich, Jacqueline M., CNM 150 mL/hr at 11/05/15 2231     . lidocaine 1% injection 1 mL  1 mL IntraDERMAL Once PRN Henrich, Jacqueline M., CNM       . lidocaine 1% injection 20 mL  20 mL Other Once PRN Henrich, Jacqueline M., CNM       . methylergonovine (METHERGINE) injection 0.2 mg  0.2 mg IntraMUSCULAR Once PRN Henrich, Jacqueline M., CNM       . metoclopramide (REGLAN) injection 10 mg  10 mg IntraVENOUS Q6H PRN Henrich, Jacqueline M., CNM   10 mg at 11/05/15 1749   . misoprostol (CYTOTEC) tablet 800 mcg  800 mcg Rectal Once PRN Henrich, Jacqueline M., CNM       . oxytocin (PITOCIN) 10 units/500 mL LR infusion  0.5-16 milli-units/min IntraVENOUS Continuous Henrich, Jacqueline M., CNM   Stopped at 11/05/15 1400   . oxytocin (PITOCIN) 20 Units in lactated ringers 1000 mL infusion   IntraVENOUS Continuous Henrich, Jacqueline M., CNM   Stopped at 11/05/15 1400   . oxytocin (PITOCIN) 40 Units in 0.9 % sodium chloride 1000 mL infusion   IntraVENOUS Once PRN Henrich, Jacqueline M., CNM       . sodium chloride (PF) 0.9 % flush 3 mL  3 mL IntraVENOUS Q8H Henrich, Jacqueline M., CNM       . sodium chloride (PF) 0.9 % flush 3 mL  3 mL IntraVENOUS PRN Henrich, Jacqueline M., CNM       . sodium chloride 0.9 % TKO infusion   IntraVENOUS Continuous PRN Henrich, Jacqueline M., CNM       .  sodium citrate-citric acid (BICITRA) 500-334 MG/5ML solution 30 mL  30 mL Oral Once PRN Henrich, Jacqueline M., CNM       . terbutaline (BRETHINE) injection 0.25 mg  0.25 mg Subcutaneous Q20 Min PRN Henrich, Jacqueline M., CNM         Facility-Administered Medications Ordered in Other Encounters   Medication Dose Route Frequency Provider Last Rate Last Dose   . bupivacaine 0.125% injection   Epidural Intra-Op PRN Doan, Christina Ng, MD   10 mL at 11/05/15 2237   . lidocaine (XYLOCAINE) 1.5 % 3 mL, EPINEPHrine 5 mcg/mL epidural injection   Epidural Intra-Op Continuous Doan, Christina Ng, MD   3 mL at 11/05/15 2235     No Known Allergies    Labs and Other Data  Lab Results   Component Value Date    GLU 74 07/16/2015     No results found for: AST, ALT, GGT, LDH, ALK, TP, ALB, TBILI, DBILI  Lab Results   Component Value Date    WBC 12.9 11/05/2015    RBC 4.50 11/05/2015    HGB 13.5 11/05/2015    HCT 39.2 11/05/2015    MCV 87.1 11/05/2015    MCHC 34.4 11/05/2015    RDW 13.6 11/05/2015    PLT 174 11/05/2015    MPV 12.5 11/05/2015    SEG 63 08/27/2015    LYMPHS 22 08/27/2015    MONOS 12 08/27/2015    EOS 1 08/27/2015     No results found for: INR, PTT  No results found for: ARTPH, ARTPO2,   ARTPCO2    Anesthesia Plan:  ASA 2 (Mild systemic disease)     Planned anesthesia method: epidural   Planned monitoring method: Routine monitoring      Comments:    Dental risks explained & discussed  Anesthetic plan and risks discussed with Patient.

## 2015-11-05 NOTE — Interdisciplinary (Signed)
Per CNM Veverly FellsJ. Heinrich ok to take patient off the monitor for a shower, Patient provided with shower necessities and IV covered. Patient advised to call this RN as soon as she is otu of the shower. Patient VU.

## 2015-11-05 NOTE — Progress Notes (Signed)
Labor Progress Note    Called to room for deceleration. Patient had CLE placed about 30 min prior and had just been AROM'ed by CNM with thick meconium. BPs noted to be mildly low - 80s-90s/40-50s.     Upon entry to room, pt on hands & knees and FHR in 80s, then 100s. Appeared that HR had been down for ~ 4 min, though RN was having trouble getting patient on the monitor. When monitor adjusted, FHR immediately audible up to 130s-140s, so unclear how long pt was down. Within the minute, FHR audibly decelerated again to 70s-80s x 2 min. Uterus palpated moderate. SVE repeated by CNM and with no further dilation but progressing station at 5/C/0. Terbutaline called to room, but by time of arrival FSE placed and HR recovered again. RN instructed to administer terbutaline if another deceleration occurred.     Patient had another decel to 80s about 3 minutes later, terb given @ 2305. Anesthesia called to room and administered epinephrine for BP support. FHR then recovered and has now been in 140s with smaller variables noted. Mod variability noted throughout tracing with excellent fetal recovery.     Discussed with patient that rupture of membranes and low BP are identifiable possible causes of decelerations. However, if decelerations continue or patient has another prolonged deceleration, would crecommend Cesarean section as safest way to proceed with delivery. C-section consent reviewed as below and placed in the chart.     I have reviewed her interval History and Physical and have discussed the risks of cesarean section as described below.      Risks of cesarean section:  -- Pain  -- Bleeding, with the possibility or requiring a transfusion. Risk of transfusion include allergic reaction (1/50,000), transmission of HIV (1/2 million), Hepatitis B or C 1/500,000 -750,000  -- Infection, requiring intravenous antibiotics and potentially prolonged hospital stay  -- Damage to surrounding organs, not limited to baby (<1%), bowel,  bladder, nerves, intestines, vessels, ureters.  -- Possible need for hysterectomy in the event of irreversible catastrophic bleeding (<1%)  -- Wound complications not limited to separation and/or infection  -- Medical complications not limited to deep venous thrombosis, pulmonary embolism, cardiovascular accident, myocardial infarction, death    Time was allowed to answer all of the patients questions and concerns. The patient signed her consent in front of me and this was witnessed by her nurse.    At this time, continued care per CNM service.     Truett PernaErin Colleen Deyna Carbon, MD  Signature Derived From Controlled Access Password, November 05, 2015, 11:53 PM

## 2015-11-05 NOTE — Interdisciplinary (Signed)
Pt oriented to room, call light, side rails up x2, bedside table within reach. Pt denies questions at this time.

## 2015-11-05 NOTE — Interdisciplinary (Signed)
[facility]

## 2015-11-05 NOTE — Interdisciplinary (Signed)
CNM Henrich at bedside to assess patient and discuss plan of care. Patient to be admitted for augmentation/induction. Tracing reviewed w/ provider

## 2015-11-05 NOTE — Progress Notes (Signed)
CNM LABOR PROGRESS NOTE    S:  Pt comfortable with CLE.  Pt's M & S at the bedside, supportive.  Pt c/o some nausea and would like Reglan again.    O:    BP 109/66  Pulse 85  Temp 98.1 F (36.7 C)  Resp 16  Ht 5\' 6"  (1.676 m)  Wt 71.2 kg (157 lb)  LMP 01/21/2015 (Exact Date)  SpO2 100%  BMI 25.34 kg/m2    FHT's: 115  bpm with accels.  Moderate variability.  Decel to 90 bpm x 6-7 mins at 2253 s/p AROM for thick mec at 2250.  Pt repositioned to L then R then hands and knees.  FSE placed, O2 plced 10/li/min/mask.  And [facility].    [place]'s:  Q 2-3 mins x 60 secs, mod per palpation.  0.25 mgTerb given SQ at 2305  SVE: 5.5/C/0 OA vs. OP.  Cx soft/mid  Voiding-foley placed and draining clear yellow urine to gravity.    A:  Lauren Booth is a 30 year old female with G2P0010 at 2961w1d  Spontaneous decel s/p AROM.  Decreased BP's.  Anesthesia notified and Ephedrine given x 2 doses IV.    P:  1)  Con't to monitor maternal/fetal status.  2)  Con't to provide labor support.  3)  Repeat Reglan now.  4)  Dr. Heide GuileSwor consented pt for possible C/S.  5)  Will start Pitocin per protocol if FHR tracing Cat I in about 20-30 mins.  6)  SVE 2-3 hrs after regular/strong [place]'s and PRN.  7)  Pt and family aware of pt status and aware that we will con't to monitor the FHR tracing closely.  Achille Richebecca C. Garrett-Brown, CNM [id number]

## 2015-11-05 NOTE — Interdisciplinary (Signed)
Care relinquished back to primary RN

## 2015-11-05 NOTE — Interdisciplinary (Signed)
Patient presents to labor and delivery after leaving [facility] to be admitted or induction of labor for decels. Patient was noted to be 2cm there.  Will page CNM to evaluate patient. Patient oriented to room and call light

## 2015-11-05 NOTE — Interdisciplinary (Signed)
CNM at bedside discussing POC with pt.

## 2015-11-05 NOTE — Interdisciplinary (Signed)
Pt discussing POC with family.

## 2015-11-05 NOTE — Interdisciplinary (Signed)
Pt requesting to speak with CNM before pitocin is started.

## 2015-11-05 NOTE — Procedures (Signed)
ANTENATAL BIOPHYSICAL TESTING   Demographics:  Date: November 05, 2015   Patient Name: Lauren Booth   Medical Record #: [id number]   DOB: 02/26/1985  Age: 30 year old  Sex: female  GA: 9522w1d   Obstetric History    G2   P0   T0   P0   A1   L0     SAB1   TAB0   Ectopic0   Multiple0   Live Births0      BP 110/78 (BP Patient Position: Sitting)  Resp 18  LMP 01/21/2015 (Exact Date)    Clinic Location:  [facility]   [facility]  [address]  [place] [id number]  Requested by:      No Pcp, Per Patient    Encounter Diagnoses   Name Primary?   . Supervision of other high risk pregnancy, antepartum, third trimester    . Post term pregnancy over 40 weeks      Fetal Kick Counts: done - > 10 per hour  Tobacco History:   History   Smoking Status   . Never Smoker   Smokeless Tobacco   . Never Used     Medications:   No current facility-administered medications for this visit.      No current outpatient prescriptions on file.     Facility-Administered Medications Ordered in Other Visits   Medication   . ondansetron (ZOFRAN) injection 4 mg       Nonstress Test: Unsatisfactory  Baseline: 135  Variability: Moderate 6 - 25 bpm  Contractions: 0  Time on: 0846     Time Off: 0856    Handouts given: None Given  Patient Education: NST, [facility]KC and LABOR    To Labor and Delivery for further evaluation:yes  Reason for referral to L&D: fetal deceleration on tracing  Handoff Report To MD/RN: Lauren Booth. Kent RN     Audible deceleration heard on EFM.  See Centricity tracing of fetal heart rate. Patient assisted to reposition to right maternal lateral without improvement in fetal heart rate.  Patient repositioned to left maternal lateral without improvement in fetal heart rate.  Patient assisted out of chair to standing position.  Oxygen applied via face mask at 10 liters.  Fetal heart improved to 125.  Dr. Laurence Booth in NST room to evaluate patient and fetal heart rate.  Re[facility]ation for  [facility]  Patient assisted to wheelchair.  Patient taken to LD via wheelchair.    Plan: Repeat NST/AFI:to labor and delivery for further follow-up    Lauren Abrahamserina Athol Bolds, RN, RN

## 2015-11-05 NOTE — Interdisciplinary (Signed)
Patient in clinic for antenatal testing.  Patient's mother reports that her daughter initially didn't feel well at home prior to clinic visit.  Patient and mother states that she "felt that her heart was beating very fast like it does when I exercise".  Patient reports feeling better now and that symptoms went away.  Patient facial appearance pink, dry and perfused.  Blood pressure and pulse within normal limits.  See Epic flow sheet for values. Respiratory rate even and without evidence of distress.  Smiling and talking with mother and other patient's in the antenatal testing room.  Instructed patient to inform RN if not feeling well again.  Patient verbalizes understanding and agreeable to POC.

## 2015-11-05 NOTE — Interdisciplinary (Signed)
Report given to Eliot Fordluann younes RN. Patient care relinquished.     Wyonia HoughKari Hebert Jareli Highland, RN

## 2015-11-05 NOTE — H&P (Signed)
Obstetric Admission History and Physical    Clinic: Prenatal Care Source: MOS-CNM    Chief Complaint   Patient presents with   . Other           History of Present Illness:  Lauren Booth [id number] is a 30 year old G2P0010 at 38w1dEGA. Sent from antenatal testing due to a a prolonged fetal heart rate deceleration during [facility]enatal testing at [employer]  Pt reports IUCs off and on 3 days, currently reports IUCs Q10-20 min, mild.      Pt denies: decreased fetal movement, dysuria, headache , hypertension, leakage of fluid and vaginal bleeding    Pregnancy Issues:  #.   Patient Active Problem List    Diagnosis Date Noted   . Preterm labor in third trimester without delivery 10/04/2015     Priority: High     10/04/15  Pt here to r/o PTL.  GBS obtained.  CT/GC repeated and UA sent.  Cx Closed/75%/-1 Vtx IBOW.  Cx mid/medium.  Pt d/c'd to home undelivered.     . Encounter for supervision of normal first pregnancy in second trimester 07/16/2015     Priority: High     CNM PROBLEM LIST (MOS)  Desires only female students  [x]   Transfer of care at 23+ wks [place] labs [place]    [x ] 36 wks complete chart check intials; CAC    [ ]  APT for ___ @ ___    Dating: by certain LMP EDD 92/84/13c/w 7+1 wk ultrasound    Labs:  Prenatal Results         1st Trimester Date Time   ABO/RH (External Lab)      ABO/RH ([facility])  O POS  07/16/15 1112   Antibody ID (External Lab)      Antibody ID ([facility])  NEG  07/16/15 1112   HGB  13.0 gm/dL 08/27/15 1443   HCT  36.8 % 08/27/15 1443   MCV  88.0 um3 08/27/15 1443   Platelet Count  174 1000/mm3 08/27/15 1443   Rubella IgG Antibody  Negative  (A) 07/16/15 1112   Varicella IgG Antibody  POSITIVE mL 07/16/15 1112   HBsAG  Non Reactive  07/16/15 1102   Chlamydia/GC PCR Specimen Source      Chlamydia: Genital      Chlamydia PCR      Chlamydia: Urine  Not Detected  10/04/15 2100   GC: Genital      Gonococcus PCR      GC: Urine  Not Detected  10/04/15 2100   RPR  (External Lab)      Urine Culture  Mixed Urethral Flora  07/16/15 1108   HIV Screen  Non Reactive  08/13/15 1248   HIV Rapid Screen      Quantiferon TB (External Lab)      Quantiferon TB ([facility])  Negative  07/16/15 1112   Cystic Fibrosis, 39 Mutations, Blood ([place])  Background  Cystic fibrosis (CF) is an autosomal recessive disorder resulting from   mutations within the cystic fibrosis transmembrane conductance regulator   (CFTR) gene. Manifestations of the disease include chronic obstructive lung   disease and pancreatic enzyme insufficiency. Greater than 1,500 causative   variants have been described within the CFTR gene. The most common variant,   F508del, accounts for approximately 67% of the mutations worldwide and   approximately 75% in the [place]. Most of the   remaining mutations are rare, with  some having a higher prevalence in certain   ethnic groups. Variants detected by this assay include the 23 mutations   currently recommended by the [employer]stetricians and Gynecologists (ACMG/ACOG) as well as 16 other   mutations as listed below:    G85E (c.254G>A)  R117H (c.350G>A)  R334W (c.1000C>T)  R347P (c.1040G>C)  R347H (c.1040G>A)  394delTT (c.262_263delTT)  07/16/15 1112   1st Trimester Screen      Pap Smear (External)      Pap Smear ([facility])      Syphilis EIA Screen  Negative  08/13/15 1248   PPD result      2nd & 3rd Trimester Date Time   2nd Trimester Screen       GBS  No growth of Group B Streptococcus  10/04/15 2335   HGB  13.0 gm/dL 08/27/15 1443   HCT  36.8 % 08/27/15 1443   MCV  88.0 um3 08/27/15 1443   Platelet Count  174 1000/mm3 08/27/15 1443   Diabetes Screen Date Time   Fasting Blood Sugar  74 mg/dL 07/16/15 1112   HgB A1C  4.4 % (L) 07/16/15 1112   GTT 2 hr 75 gm Date Time   GTT 0  67 mg/dL (L) 07/16/15 1112   GTT 1  120 mg/dL 07/16/15 1112   GTT 2  123 mg/dL 07/16/15 1112   GTT 1 hr 50 gm Date Time   GTT  1 Hr Post Dose      GTT Date Time   GTT 0      GTT 1      GTT 2      GTT 3      OB Ultrasounds & Imaging Date Time   76801- [place] 1st Tri OB < 14 wks       13086- [place] 2nd/3rd Tri OB > 14 wks Anatomy Scan/Low Risk  (See Report) 07/12/15 1254   57846- [place] 2nd/3rd Tri OB > 14 wks Anatomy Scan/High Risk       96295- [place] Limited/Follow-Up No Growth       76816- [place] Follow-Up OB       76817- US OB Transvaginal/Cervical Length      28413- Fetal Echocardiography      24401- US Guided CVS       02725- US Guided Aminocentesis       Chest X-Ray             Legend: ^: External Procedure Result          [x] Pt feels no ZIKA exposure     Genetic screening results  [-] 1st tri screen/NT done in [place], nl (see media)  [-] 2nd tri/Quad  Not done    [x]  3rd tri Hgb 13  [x ] BC eligible  YES  [-] Previous client no  [ x] Breast pump from [facility], has BF class appt 10/09/15    Vaccines:  [x]  TdaP   [ ]  Flu     . Screening for cervical cancer 10/04/2015     Priority: Low     LPS:  9/19: Patient brought records from [place] re: pap, colpo, Biopsy  Needs pap PP [  ]         . Hemorrhoids during pregnancy 09/27/2015     Priority: Low     Pt using Prep H     . Rubella non-immune status, antepartum 07/20/2015     Priority: Low     [  x ] inform pt  [  ] PP MMR     . Chlamydia 07/19/2015     Priority: Low     [x]  pt notified by telephone of RX 07/19/15. Husband unfaithful.  [x]  TOC 08/13/15 neg  [x  ] 3rd trimester GC/CT-repeated on L&D 10/04/15: neg    Aware not eligible for expectant mgmt if PROM       . Stress due to marital problems 07/16/2015     Priority: Low     EDS = 18  Separated from husband  [  ] [place] ref done[x]    [x]  MSW consult ordered/ saw SW 10/05/15     . Hx of breast implants, bilateral 07/16/2015     Priority: Low     Pt plans to nurse       . Irregular uterine contractions 11/03/2015     Triaged 11/03/15 @ 0145 for labor: SVE 1/80/-3/IBOW/mid-posterior/vtx ( previous 1/60% per patient report in clinic today)- patient desired therapeutic rest w/  morphine/phenergan      . Vaginal leukorrhea 10/29/2015     R/o SROM, NEGATIVE. Discharged undelivered           Dating Criteria:  Patient's last menstrual period was 01/21/2015 (exact date).  Dated by: sure LMP  Final EDD: Estimated Date of Delivery: 10/28/15    Ultrasound Exams:   Study Date EGA by u/s Comments   1st Trimester Scan 03/16/15 66w1dconsistent with LMP   Anatomy Scan 07/12/15 28w4dormal anatomy     Past OB History:  OB History   Gravida Para Term Preterm AB Living   2    1    SAB TAB Ectopic Multiple Live Births   1          # Outcome Date GA Lbr Len/2nd Weight Sex Delivery Anes PTL Lv   2 Current            1 SAB 202010-03-14w29w0d              History of a newborn with GBS disease: no    GYN history:   History of STI (including genital HSV): HX pos chlamydia, s/p treatment.  Denies HX genital herpes.      Medical History:  Past Medical History:   Diagnosis Date   . History of chicken pox    . History of cone biopsy of [place]ix     [place] 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 Seasonal allergies      Asthma no  Hypertension no  Transfusions: no  Accepts Blood: yes       Surgical History:  Past Surgical History:   Procedure Laterality Date   . breast implants  20103/14/15 [place] NOSE SURGERY      due to broken nose     Prior uterine surgery: No prior uterine surgery      Family History:   Family History   Problem Relation Age of Onset   . Other Father      bell's palsy   . Hypertension Maternal Grandmother    . Stroke Maternal Grandfather    . Other Paternal Grandmother 80 67  [place]   . Heart Attack Paternal Grandfather 60 45  died in [address]-14-1990   Bleeding abnormalities no  Anesthesia complications no      Social History:  Social History  Substance Use Topics   . Smoking status: Never Smoker   . Smokeless tobacco: Never Used   . Alcohol use No         Social History     Social History Narrative    Together with FOB x 4 yrs, married in Nov, planned pregnancy    Now separated x 1 mos.  Pt  feels partner unfaithful.  FOB kicked her out of house.     Now pt lives with her mother./ supportive.  Also has friends in [place]    FOB aware of pregnancy, but not supportive, not in touch.    Pt with increased stress.        EDS Score (Calculated): 18 (07/16/2015  8:00 AM)  Medications:  No current facility-administered medications on file prior to encounter.      Current Outpatient Prescriptions on File Prior to Encounter   Medication Sig Dispense Refill   . acetaminophen (TYLENOL) 500 MG tablet Take 1 tablet (500 mg) by mouth every 6 hours as needed for Mild Pain (Pain Score 1-3). 30 tablet 0   . hydrocortisone 1 % cream Apply 1 Application topically 2 times daily. Apply to affected area 1 Tube 1   . loratadine (CLARITIN) 10 MG tablet Take 10 mg by mouth daily.     . Prenatal MV-Min-Fe Fum-FA-DHA (PRENATAL 1 PO)        Allergies:   Review of patient's allergies indicates no known allergies.    System Review (Pertinent Positives)  As in HPI    Physical Exam     There is no height or weight on file to calculate BMI.    General Appearance: no acute distress  Neck:  negative, Neck supple. No adenopathy, thyroid symmetric, normal size.  Heart:  normal rate and regular rhythm, no murmurs, clicks, or gallops.  Lungs: clear to auscultation.  Abdomen: gravid, nontender    Extremities: Normal  Neuro: negclonus, 2+ deep tendon reflexes    Pelvic:  SVE: 2cm / 50% / -1 station / mid position / soft consistency / VTX, BOWI --> exam by RN Ernst Bowler.   Total Bishop score if indicated:    Cervical Dilation: 1 = 1 cm to 2 cm  Cervical Effacement: 1 = 40 - 50%  Cervical Position: 1 = middle  Cervical Consistency: 2 = soft  Fetal Station: 2 = -1 to 0  Bishop Score: 7  Clinical Pelvimetry: Adequate  FHR: baseline 130, moderate variability, pos accelerations, pos intermittent variable decelerations  Uterine Activity: IUCs   Bedside ultrasound: deferred, VTX by SVE.       Assessment/Plan:  30 year old G2P0010 at 74w1dadmitted for induction  of labor s/t cat II FHTs.  Reviewed with Dr CEdwina Barth [facility] intermittent fetal heart rate decelerations.  Cervix is favorable, bishops score =7, plan for IOL with pitocin and will consider foley bulb to assist with manual dilation.   Discussed at length with pt and pt's mother fetal indication for IOL at this time.  Discussed intention for progress to NSVD and also possibility of need for cesarean delivery in the event of fetal intolerance of labor.  Discussed that if a cesarean section is advised, it could be emergent and in this case, we would move quickly to the OR and precede with cesarean section. Pt v.u., accepting induction of labor and associated risks.  Pt aware that in the event that infant requires NICU services, [facility] and pt would be able to join baby there s/p discharge. Pt v.u.   - Admit to: Labor and Delivery  - VSS, afebrile.   - Fetal well-being: Category 2. Plan: continuous FHR monitoring  - Pain Management options reviewed  - Potential neonatal issues: yes - due to IOL for CAT II FHTs, Pediatrics to be notified.   - Prenatal labs:  - MBT O pos  - Antibody Screen negative   - Rubella Non-Immune  - HBsAg negative  - GC negative  - Chlamydia negative  - Syphilis nonreactive  - GBS  negative  - HIV nonreactive  - PPD/Quantiferon negative  - Chest X ray not done  - Genetic screening: not done  - CF screening: neg  - Varicella Immune  - Diabetes screening: wnl    - Consultations: n/a   - Feeding Plan: Breastmilk  - Disposition: Anticipate NSVD  - Immunization plan: MMR and Influenza   - Desires BTL: No Papers valid: No  - Candidate for immediate postpartum LARC: No  - Is patient enrolled in research study? No  - Is patient eligible for research study? no    Code Status: Full code  Discussed with Dr. Edwina Barth, attending physician.    ADDENDUM:  Pt states she is very concerned about delivery at [facility] given that infant would need to be transported  to [facility] if NICU services were needed.  Discussed at length that our recommendation is that patient stay here for IOL and delivery given CAT II FHT tracing with intermittent variable decelerations.  Pt informed RN that her plan is to leave AMA and go now to [facility].  Again discussed with patient that our recommendation is that she remain here for continuous fetal monitoring, and induction of labor given non-reassuring fetal status.  Pt v.u. And states she plans to go now to [facility].  Discussed potential risk of deteriorating fetal status during transport to [facility] as well as possibility of IUFD, pt v.u, states she is taking responsibility for this risk and will be leaving now to [facility].  [place] notified and alerted of pt status and pt's plan to go now to [facility].  Charge RN notified [place] charge.     Discussed with Dr. Edwina Barth, attending physician.    Sandi Mealy, CNM #[id number]      Margaretann Loveless, 11/05/2015, 9:24 AM

## 2015-11-05 NOTE — Interdisciplinary (Signed)
Patient in LDR 3 admitted for IOL. Patient is painfully contracting at this time. Per CNM patient is ok to eat and shower  Before pitocin is started.

## 2015-11-05 NOTE — Interdisciplinary (Signed)
Assumed care during primary RN's break

## 2015-11-05 NOTE — Consults (Signed)
OBSTETRICS CONSULT    Clinic/CNM provider: CNM  Consult indicaton: decelerations at term     HPI: Lauren Booth is a 30 year old G2P0010 at 21w1dwho presents for induction of labor for fetal heart rate decelerations and late term.     Dating  Patient's last menstrual period was 01/21/2015 (exact date).  Final Estimated Date of Delivery: 10/28/15 as based [place]   Patient Active Problem List    Diagnosis Date Noted   . Encounter for supervision of normal first pregnancy in second trimester 07/16/2015     Priority: High     CNM PROBLEM LIST (MOS)  Desires only female students  [x]   Transfer of care at 23+ wks with [place] labs [place]   [x ] 36 wks complete chart check intials; CAC    [ ]  APT for ___ @ ___    Dating: by certain LMP EDD 10/12/28/16/w 7+1 wk ultrasound    Labs:  Prenatal Results         1st Trimester Date Time   ABO/RH (External Lab)      ABO/RH ([facility])  O POS  11/05/15 0910   Antibody ID (External Lab)      Antibody ID ([facility])  NEG  11/05/15 0910   HGB  13.8 gm/dL 11/03/15 0205   HCT  39.5 % 11/03/15 0205   MCV  86.1 um3 11/03/15 0205   Platelet Count  177 1000/mm3 11/03/15 0205   Rubella IgG Antibody  Negative  (A) 07/16/15 1112   Varicella IgG Antibody  POSITIVE mL 07/16/15 1112   HBsAG  Non Reactive  07/16/15 1102   Chlamydia/GC PCR Specimen Source      Chlamydia: Genital      Chlamydia PCR      Chlamydia: Urine  Not Detected  10/04/15 2100   GC: Genital      Gonococcus PCR      GC: Urine  Not Detected  10/04/15 2100   RPR (External Lab)      Urine Culture  Mixed Urethral Flora  07/16/15 1108   HIV Screen  Non Reactive  08/13/15 1248   HIV Rapid Screen      Quantiferon TB (External Lab)      Quantiferon TB ([facility])  Negative  07/16/15 1112   Cystic Fibrosis, 39 Mutations, Blood ([place])  Background  Cystic fibrosis (CF) is an autosomal recessive disorder resulting from   mutations within the cystic fibrosis transmembrane conductance regulator   (CFTR) gene.  Manifestations of the disease include chronic obstructive lung   disease and pancreatic enzyme insufficiency. Greater than 1,500 causative   variants have been described within the CFTR gene. The most common variant,   F508del, accounts for approximately 67% of the mutations worldwide and   approximately 75% in the [place]. Most of the   remaining mutations are rare, with some having a higher prevalence in certain   ethnic groups. Variants detected by this assay include the 23 mutations   currently recommended by the [employer] (ACMG/ACOG) as well as 16 other   mutations as listed below:    G85E (c.254G>A)  R117H (c.350G>A)  R334W (c.1000C>T)  R347P (c.1040G>C)  R347H (c.1040G>A)  394delTT (c.262_263delTT)  07/16/15 1112   1st Trimester Screen      Pap Smear (External)      Pap Smear ([place]  [facility])      Syphilis EIA Screen  Negative  08/13/15 1248   PPD result      2nd & 3rd Trimester Date Time   2nd Trimester Screen       GBS  No growth of Group B Streptococcus  10/04/15 2335   HGB  13.8 gm/dL 11/03/15 0205   HCT  39.5 % 11/03/15 0205   MCV  86.1 um3 11/03/15 0205   Platelet Count  177 1000/mm3 11/03/15 0205   Diabetes Screen Date Time   Fasting Blood Sugar  74 mg/dL 07/16/15 1112   HgB A1C  4.4 % (L) 07/16/15 1112   GTT 2 hr 75 gm Date Time   GTT 0  67 mg/dL (L) 07/16/15 1112   GTT 1  120 mg/dL 07/16/15 1112   GTT 2  123 mg/dL 07/16/15 1112   GTT 1 hr 50 gm Date Time   GTT 1 Hr Post Dose      GTT Date Time   GTT 0      GTT 1      GTT 2      GTT 3      OB Ultrasounds & Imaging Date Time   76801- [place] 1st Tri OB < 14 wks       61607- [place] 2nd/3rd Tri OB > 14 wks Anatomy Scan/Low Risk  (See Report) 07/12/15 1254   37106- [place] 2nd/3rd Tri OB > 14 wks Anatomy Scan/High Risk       26948- [place] Limited/Follow-Up No Growth       54627- [place] Follow-Up OB       03500- US OB Transvaginal/Cervical Length      93818- Fetal  Echocardiography      29937- US Guided CVS       16967- US Guided Aminocentesis       Chest X-Ray             Legend: ^: External Procedure Result          [x] Pt feels no ZIKA exposure     Genetic screening results  [-] 1st tri screen/NT done in [place], nl (see media)  [-] 2nd tri/Quad  Not done    [x]  3rd tri Hgb 13  [x ] BC eligible  YES  [-] Previous client no  [ x] Breast pump from [facility], has BF class appt 10/09/15    Vaccines:  [x]  TdaP   [ ]  Flu     . Screening for cervical cancer 10/04/2015     Priority: Low     LPS:  9/19: Patient brought records from [place] re: pap, colpo, Biopsy  Needs pap PP [  ]         . Hemorrhoids during pregnancy 09/27/2015     Priority: Low     Pt using Prep H     . Rubella non-immune status, antepartum 07/20/2015     Priority: Low     [ x ] inform pt  [  ] PP MMR     . Chlamydia 07/19/2015     Priority: Low     [x]  pt notified by telephone of RX 07/19/15. Husband unfaithful.  [x]  TOC 08/13/15 neg  [x  ] 3rd trimester GC/CT-repeated on L&D 10/04/15: neg    Aware not eligible for expectant mgmt if PROM       . Stress due to marital problems 07/16/2015     Priority: Low     EDS = 18  Separated from  husband  [  ] [place] ref done[x]    [x]  MSW consult ordered/ saw SW 10/05/15     . Hx of breast implants, bilateral 07/16/2015     Priority: Low     Pt plans to nurse       . Maternal care for fetal decelerations during pregnancy 11/05/2015   . Labor and delivery indication for care or intervention 11/05/2015     11/05/15: sent from the ANT for variable decelerations/IOL         History of BloodTransfusions:   This patient has not ever received a blood transfusion and she does accept blood for lifesaving purposes.    Fetal Ultrasounds  Most Recent Scan 07/12/15 23w4 Normal anatomy, placenta     Obstetric History    G2   P0   T0   P0   A1   L0     SAB1   TAB0   Ectopic0   Multiple0   Live Births0       # Outcome Date GA Lbr Len/2nd Weight Sex Delivery Anes PTL Lv   2 Current            1 SAB 2010 [redacted]w[redacted]d                 Past Medical History:   Diagnosis Date   . History of chicken pox    . History of cone biopsy of cervix     [place]  . 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 Seasonal allergies        Past Surgical History:   Procedure Laterality Date   . breast implants  2015    [place]  . NOSE SURGERY      due to broken nose       No Known Allergies    Physical exam   11/05/15  1258   BP: 108/68   Pulse: 83   Resp: 16   Temp: 98.6 F (37 C)     Maternal exam per CNM    COMPLETE PELVIC ASSESSMENT (as per CNM)  SVE: 2cm / 50% / -1  no    FHT: 120's, moderate variability, pos accelerations, small variable decelerations. Reassuring yes overall.   [place]: irregular    Labs:  Lab Results   Component Value Date    ABSCREEN NEG 11/05/2015    MCV 87.1 11/05/2015      Lab Results   Component Value Date    HGB 13.5 11/05/2015    HCT 39.2 11/05/2015    MCV 87.1 11/05/2015    PLT 174 11/05/2015    No results found for: RPR GBS: negative    Assessment and Plan  30year old G2P0010 at 466w1ddmitted to the Nurse Midwifery service for IOL, requests obstetrical consult for pitocin  1. Labor- Pitocin augmentation indicated at this time. Remainder of labor care per CNM service.   2. Fetal well-being- Reassuring, continuous external fetal monitoring.  3. Prenatal labs- In order.  GBS negative  4. Pain- No contraindication to regional anesthesia when desired by patient.  5. Disposition- Anticipate normal spontaneous vaginal delivery.    Thank you for this consult    Plan of care discussed with obstetrics attending, Dr. MiSabra Heck  Anastaisa Wooding Ma[phone]

## 2015-11-05 NOTE — H&P (Signed)
Obstetric Admission History and Physical    Clinic: [facility]    History of Present Illness:  Lauren Booth [id number] is a 30 year old G2P0010 at 44w1dEGA who presents for  induction of labor. [facility] after a deceleration in ANT. Was recommended to stay and proceed with IOL for variable decelerations in otherwise reassuring strip. Lauren Booth agrees with IOL but [facility]. Here now with her mother, father, and sister for support. Separated from her husband/FOB. [place] [facility] [place]. History is significant for bilateral breast implants, rubella non-immune, history of chlamydia this pregnancy-TOC negative X2.  Met [name] and her family, reviewed recommendation for low dose pitocin for induction of labor. Unable to use cervical ripening balloon, cytotec, too many contractions for cervidil. Questions answered, agrees with recommendation. Plans to shower, eat lunch, then start pitocin. Desires volunteer doula with active labor. Reviewed positions to aid in rotation from OP.     Pt reports: contractions, mild  Pt denies: decreased fetal movement, headache , leakage of fluid and vaginal bleeding    Pregnancy Issues:    Patient Active Problem List    Diagnosis Date Noted   . Encounter for supervision of normal first pregnancy in second trimester 07/16/2015     Priority: High     CNM PROBLEM LIST ([facility])  Desires only female students  [x]   Transfer of care at 23+ wks with no labs ([place]    [x ] 36 wks complete chart check intials; CAC    [ ]  APT for ___ @ ___    Dating: by certain LMP EDD 92/99/24c/w 7+1 wk ultrasound    Labs:  Prenatal Results         1st Trimester Date Time   ABO/RH (External Lab)      ABO/RH ([facility])  O POS  11/05/15 0910   Antibody ID (External Lab)      Antibody ID ([facility])  NEG  11/05/15 0910   HGB  13.8  gm/dL 11/03/15 0205   HCT  39.5 % 11/03/15 0205   MCV  86.1 um3 11/03/15 0205   Platelet Count  177 1000/mm3 11/03/15 0205   Rubella IgG Antibody  Negative  (A) 07/16/15 1112   Varicella IgG Antibody  POSITIVE mL 07/16/15 1112   HBsAG  Non Reactive  07/16/15 1102   Chlamydia/GC PCR Specimen Source      Chlamydia: Genital      Chlamydia PCR      Chlamydia: Urine  Not Detected  10/04/15 2100   GC: Genital      Gonococcus PCR      GC: Urine  Not Detected  10/04/15 2100   RPR (External Lab)      Urine Culture  Mixed Urethral Flora  07/16/15 1108   HIV Screen  Non Reactive  08/13/15 1248   HIV Rapid Screen      Quantiferon TB (External Lab)      Quantiferon TB ([facility])  Negative  07/16/15 1112   Cystic Fibrosis, 39 Mutations, Blood ([place])  Background  Cystic fibrosis (CF) is an autosomal recessive disorder resulting from   mutations within the cystic fibrosis transmembrane conductance regulator   (CFTR) gene. Manifestations of the disease include chronic obstructive lung   disease and pancreatic enzyme insufficiency. Greater than 1,500 causative  variants have been described within the CFTR gene. The most common variant,   F508del, accounts for approximately 67% of the mutations worldwide and   approximately 75% in the [place] population. Most of the   remaining mutations are rare, with some having a higher prevalence in certain   ethnic groups. Variants detected by this assay include the 23 mutations   currently recommended by the [place] and [employer] of [employer]stetricians and Gynecologists (ACMG/ACOG) as well as 16 other   mutations as listed below:    G85E (c.254G>A)  R117H (c.350G>A)  R334W (c.1000C>T)  R347P (c.1040G>C)  R347H (c.1040G>A)  394delTT (c.262_263delTT)  07/16/15 1112   1st Trimester Screen      Pap Smear (External)      Pap Smear ([facility])      Syphilis EIA Screen  Negative  08/13/15 1248   PPD result      2nd & 3rd Trimester Date Time   2nd  Trimester Screen       GBS  No growth of Group B Streptococcus  10/04/15 2335   HGB  13.8 gm/dL 11/03/15 0205   HCT  39.5 % 11/03/15 0205   MCV  86.1 um3 11/03/15 0205   Platelet Count  177 1000/mm3 11/03/15 0205   Diabetes Screen Date Time   Fasting Blood Sugar  74 mg/dL 07/16/15 1112   HgB A1C  4.4 % (L) 07/16/15 1112   GTT 2 hr 75 gm Date Time   GTT 0  67 mg/dL (L) 07/16/15 1112   GTT 1  120 mg/dL 07/16/15 1112   GTT 2  123 mg/dL 07/16/15 1112   GTT 1 hr 50 gm Date Time   GTT 1 Hr Post Dose      GTT Date Time   GTT 0      GTT 1      GTT 2      GTT 3      OB Ultrasounds & Imaging Date Time   76801- [place] 1st Tri OB < 14 wks       40102- [place] 2nd/3rd Tri OB > 14 wks Anatomy Scan/Low Risk  (See Report) 07/12/15 1254   72536- [place] 2nd/3rd Tri OB > 14 wks Anatomy Scan/High Risk       64403- [place] Limited/Follow-Up No Growth       47425- [place] Follow-Up OB       95638- US OB Transvaginal/Cervical Length      75643- Fetal Echocardiography      32951- US Guided CVS       88416- US Guided Aminocentesis       Chest X-Ray             Legend: ^: External Procedure Result          [x] Pt feels no ZIKA exposure     Genetic screening results  [-] 1st tri screen/NT done in [place], nl (see media)  [-] 2nd tri/Quad  Not done    [x]  3rd tri Hgb 13  [x ] BC eligible  YES  [-] Previous client no  [ x] Breast pump from [facility], has BF class appt 10/09/15    Vaccines:  [x]  TdaP   [ ]  Flu     . Screening for cervical cancer 10/04/2015     Priority: Low     LPS:  9/19: Patient brought records from [place] re: pap, colpo, Biopsy  Needs pap PP [  ]         .  Hemorrhoids during pregnancy 09/27/2015     Priority: Low     Pt using Prep H     . Rubella non-immune status, antepartum 07/20/2015     Priority: Low     [ x ] inform pt  [  ] PP MMR     . Chlamydia 07/19/2015     Priority: Low     [x]  pt notified by telephone of RX 07/19/15. Husband unfaithful.  [x]  TOC 08/13/15 neg  [x  ] 3rd trimester GC/CT-repeated on L&D 10/04/15: neg    Aware not eligible for expectant  mgmt if PROM       . Stress due to marital problems 07/16/2015     Priority: Low     EDS = 18  Separated from husband  [  ] [place] ref done[x]    [x]  MSW consult ordered/ saw SW 10/05/15     . Hx of breast implants, bilateral 07/16/2015     Priority: Low     Pt plans to nurse       . Maternal care for fetal decelerations during pregnancy 11/05/2015   . Labor and delivery indication for care or intervention 11/05/2015     11/05/15: sent from the ANT for variable decelerations/IOL           Dating Criteria:  Patient's last menstrual period was 01/21/2015 (exact date).  Dated by: sure LMP and 1st trimester ultrasound  Final EDD: Estimated Date of Delivery: 10/28/15    Ultrasound Exams:   Study Date EGA by u/s Comments   1st Trimester Scan 03/16/15 30w1dconsistent with LMP   Anatomy Scan 05/25/15 18w1 normal anatomy   Most Recent Scan 07/12/15 23w4 Normal anatomy, placenta       Past OB History:  OB History   Gravida Para Term Preterm AB Living   2    1    SAB TAB Ectopic Multiple Live Births   1          # Outcome Date GA Lbr Len/2nd Weight Sex Delivery Anes PTL Lv   2 Current            1 SAB 2010 623w0d               History of a newborn with GBS disease: no    GYN history:   History of STI (including genital HSV): yes - chlamydia, TOC negative X2    Medical History:  Past Medical History:   Diagnosis Date   . History of chicken pox    . History of cone biopsy of cervix     [place] . 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 Seasonal allergies      Asthma no  Hypertension no  Transfusions: no  Accepts Blood: yes       Surgical History:  Past Surgical History:   Procedure Laterality Date   . breast implants  2015    [place] . NOSE SURGERY      due to broken nose     Prior uterine surgery: No prior uterine surgery      Family History:   Family History   Problem Relation Age of Onset   . Other Father      bell's palsy   . Hypertension Maternal Grandmother    . Stroke Maternal Grandfather    . Other Paternal  Grandmother [id number]   [address]   . Heart Attack Paternal  Grandfather 61     died in 1990      Bleeding abnormalities no  Anesthesia complications no      Social History:  Social History   Substance Use Topics   . Smoking status: Never Smoker   . Smokeless tobacco: Never Used   . Alcohol use No         Social History     Social History Narrative    Together with FOB x 4 yrs, married in Nov, planned pregnancy    Now separated x 1 mos.  Pt feels partner unfaithful.  FOB kicked her out of house.     Now pt lives with her mother./ supportive.  Also has friends in [place]    FOB aware of pregnancy, but not supportive, not in touch.    Pt with increased stress.        EDS Score (Calculated): 18 (07/16/2015  8:00 AM)  Medications:  Current Facility-Administered Medications on File Prior to Encounter   Medication Dose Route Frequency Provider Last Rate Last Dose   . [DISCONTINUED] acetaminophen (TYLENOL) tablet 650 mg  650 mg Oral Q6H PRN Ekholm, Janna L, CNM       . [DISCONTINUED] carboprost (HEMABATE) injection 250 mcg  250 mcg IntraMUSCULAR Once PRN Ekholm, Janna L, CNM       . [DISCONTINUED] lactated ringers infusion   IntraVENOUS Continuous Ekholm, Janna L, CNM       . [DISCONTINUED] lidocaine 1% injection 1 mL  1 mL IntraDERMAL Once PRN Ekholm, Janna L, CNM       . [DISCONTINUED] lidocaine 1% injection 20 mL  20 mL Other Once PRN Margaretann Loveless, CNM       . [DISCONTINUED] methylergonovine (METHERGINE) injection 0.2 mg  0.2 mg IntraMUSCULAR Once PRN Ekholm, Janna L, CNM       . [DISCONTINUED] misoprostol (CYTOTEC) tablet 800 mcg  800 mcg Rectal Once PRN Ekholm, Janna L, CNM       . [DISCONTINUED] ondansetron (ZOFRAN) injection 4 mg  4 mg IntraVENOUS Q6H PRN Ekholm, Janna L, CNM   4 mg at 11/05/15 0927   . [DISCONTINUED] oxytocin (PITOCIN) 10 units/500 mL LR infusion  0.5-16 milli-units/min IntraVENOUS Continuous Ekholm, Janna L, CNM       . [DISCONTINUED] oxytocin (PITOCIN) 20 Units in lactated ringers 1000 mL infusion    IntraVENOUS Continuous Ekholm, Janna L, CNM       . [DISCONTINUED] oxytocin (PITOCIN) 40 Units in 0.9 % sodium chloride 1000 mL infusion   IntraVENOUS Once PRN Ekholm, Janna L, CNM       . [DISCONTINUED] sodium chloride (PF) 0.9 % flush 3 mL  3 mL IntraVENOUS Q8H Ekholm, Janna L, CNM       . [DISCONTINUED] sodium chloride (PF) 0.9 % flush 3 mL  3 mL IntraVENOUS PRN Ekholm, Janna L, CNM       . [DISCONTINUED] sodium chloride 0.9 % TKO infusion   IntraVENOUS Continuous PRN Ekholm, Janna L, CNM       . [DISCONTINUED] sodium citrate-citric acid (BICITRA) 500-334 MG/5ML solution 30 mL  30 mL Oral Once PRN Ekholm, Janna L, CNM       . [DISCONTINUED] terbutaline (BRETHINE) injection 0.25 mg  0.25 mg Subcutaneous Q20 Min PRN Margaretann Loveless, CNM         Current Outpatient Prescriptions on File Prior to Encounter   Medication Sig Dispense Refill   . acetaminophen (TYLENOL) 500 MG tablet Take 1 tablet (500 mg) by  mouth every 6 hours as needed for Mild Pain (Pain Score 1-3). 30 tablet 0   . hydrocortisone 1 % cream Apply 1 Application topically 2 times daily. Apply to affected area 1 Tube 1   . loratadine (CLARITIN) 10 MG tablet Take 10 mg by mouth daily.     . Prenatal MV-Min-Fe Fum-FA-DHA (PRENATAL 1 PO)          Allergies:   Review of patient's allergies indicates no known allergies.    System Review (Pertinent Positives)  As in HPI    Physical Exam  Temperature:  [98.6 F (37 C)-98.7 F (37.1 C)] 98.6 F (37 C) (09/29 1258)  Blood pressure (BP): (108-117)/(68-82) 108/68 (09/29 1258)  Heart Rate:  [83-93] 83 (09/29 1258)  Respirations:  [16-18] 16 (09/29 1258)  Pain Score: 4 (09/29 0924)  O2 Device: None (Room air) (09/29 1002)  O2 Flow Rate (L/min):  [10 l/min] 10 l/min (09/29 0907)  Body mass index is 25.34 kg/(m^2).    General Appearance: no acute distress  Neck:  Neck supple. No adenopathy, thyroid symmetric, normal size.  Heart:  normal rate and regular rhythm, no murmurs, clicks, or gallops.  Lungs: clear to  auscultation.  Abdomen: gravid, nontender    Extremities: Normal, No cyanosis, clubbing and No edema  Neuro: normal    Pelvic:  SSE: deferred  SVE: 2cm / 50% / -1 station / mid position / soft consistency / VTX, BOWI --> exam by RN Ernst Bowler.   Total Bishop score if indicated: 7 [facility] triage today)    Clinical EFW: 6lbs 10oz  FHR: baseline 130, moderate variability, + accelerations, occasional mild decelerations  Uterine Activity: 4-6 minutes  Bedside ultrasound: cephalic    Assessment/Plan:  30 year old G2P0010 at 61w1dadmitted for induction of labor.  - Admit to: Labor and Delivery  - Pitocin, low dose now  - Fetal well-being: Category 1. Plan: continuous FHR monitoring  - Pain Management anesthesia consult, doula, IV narcotics, Nitrous  - Potential neonatal issues: no, Pediatrics notified: no  - Prenatal labs:  - MBT O pos  - Antibody Screen negative   - Rubella Non-Immune  - HBsAg negative  - GC negative  - Chlamydia negative  - Syphilis nonreactive  - GBS  negative  - HIV nonreactive  - PPD/Quantiferon negative  - Chest X ray not done  - Genetic screening: not done  - CF screening: negative  - Diabetes screening: negative    - Consultations: Anesthesia, Social Work, LPublix - Feeding Plan: Breastmilk  - Disposition: Anticipate NSVD  - Immunization plan: N/A   - Desires BTL: No Papers valid: N/A  - Candidate for immediate postpartum LARC: No  - Is patient enrolled in research study? No  - Is patient eligible for research study? no    Code Status: Full code  Discussed with Dr. DChristen Butter attending physician.    JLovena Le Henrich, 11/05/2015, 1:35 PM

## 2015-11-05 NOTE — Procedures (Signed)
The NST is reactive with deceleration noted..     Recommendations:   [facility] for extended monitoring given decel at 41 weeks.    Read and interpreted by: Gwenevere AbbotKathryn Marie Macaulay, MD

## 2015-11-05 NOTE — Interdisciplinary (Signed)
CNM henrich extensively discussed plan of care with patient. Per CNM plan to do IOL with low dose pitocin. Patient to eat and shower prior to stating IOL ok per CNM

## 2015-11-05 NOTE — Progress Notes (Signed)
CNM LABOR PROGRESS NOTE    S:  Pt requesting CLE.  Pt's M and S at the bedside asking about a C/S.  States pt has been in labor "for 3 days".  Pt has been using Nitrous Oxide and states she is not getting much relief.  Also states she is having nausea again.  Volunteer doula was paged but has not arrived yet.    O:    BP 99/60 (BP Patient Position: Semi-Fowlers)  Pulse 83  Temp 98.1 F (36.7 C)  Resp 16  Ht 5\' 6"  (1.676 m)  Wt 71.2 kg (157 lb)  LMP 01/21/2015 (Exact Date)  BMI 25.34 kg/m2    FHT's: 125 bpm with accels.  Moderate variability.  Occas variable decels (none prolonged), occas early decels, occas late decels.  Pt has been on her left side with the peanut ball between her legs.  [place]'s: Q 5-6 mins x 80 secs, mod per palpation  SVE: 5/C/0 Vtx IBOW.  Cx soft/mid  Voiding QS    A:  Lauren Booth is a 30 year old female with G2P0010 at 2851w1d who is in early labor w/no cervical change since last exam at 1900.  Is getting more uncomfortable, having back labor and is requesting a CLE.    Adequate fetal oxygenation.  FHR tracing Cat II (Variable, late decels)    P:  1)  Con't to monitor maternal/fetal status.  2)  Con't to provide labor support.  3)  CLE consult  4)  AROM after pt is comfortable w/CLE-d/w pt.  5)  Reassured pt and her family that she is not in active labor yet and overall the FHR tracing is reassuring and there is no need for a C/S at this time.    6)  Dr. Yetta Flockriebe aware of pt status.  Achille Richebecca C. Garrett-Brown, CNM [phone]#[id number]

## 2015-11-06 ENCOUNTER — Encounter (HOSPITAL_COMMUNITY): Admission: AD | Disposition: A | Discharge status: Home Health | Payer: Self-pay | Attending: Obstetrics & Gynecology

## 2015-11-06 ENCOUNTER — Encounter (HOSPITAL_COMMUNITY): Payer: Self-pay | Admitting: Certified Nurse Midwife

## 2015-11-06 DIAGNOSIS — Z98891 History of uterine scar from previous surgery: Secondary | ICD-10-CM

## 2015-11-06 DIAGNOSIS — O480 Post-term pregnancy: Secondary | ICD-10-CM

## 2015-11-06 DIAGNOSIS — O1414 Severe pre-eclampsia complicating childbirth: Secondary | ICD-10-CM

## 2015-11-06 DIAGNOSIS — Z3A39 39 weeks gestation of pregnancy: Secondary | ICD-10-CM

## 2015-11-06 LAB — HEMOGRAM, BLOOD
Hct: 29.4 % — ABNORMAL LOW (ref 34.0–45.0)
Hgb: 10.2 gm/dL — ABNORMAL LOW (ref 11.2–15.7)
MCH: 31 pg (ref 26.0–32.0)
MCHC: 34.7 g/dL (ref 32.0–36.0)
MCV: 89.4 um3 (ref 79.0–95.0)
MPV: 11.8 fL (ref 9.4–12.4)
Plt Count: 147 10*3/uL (ref 140–370)
RBC: 3.29 10*6/uL — ABNORMAL LOW (ref 3.90–5.20)
RDW: 13.7 % (ref 12.0–14.0)
WBC: 20.2 10*3/uL — ABNORMAL HIGH (ref 4.0–10.0)

## 2015-11-06 SURGERY — Surgical Case
Anesthesia: Epidural | Wound class: Class III (Contaminated)

## 2015-11-06 SURGERY — Surgical Case

## 2015-11-06 MED ORDER — OXYTOCIN INFUSION 20 UNITS/1000 ML LR (PREMIX)
Status: AC
Start: 2015-11-06 — End: 2015-11-06
  Administered 2015-11-06: 125 mL/h via INTRAVENOUS
  Filled 2015-11-06: qty 1000

## 2015-11-06 MED ORDER — ACETAMINOPHEN 325 MG PO TABS
650.0000 mg | ORAL_TABLET | Freq: Four times a day (QID) | ORAL | Status: DC
Start: 2015-11-07 — End: 2015-11-10
  Administered 2015-11-06 – 2015-11-10 (×15): 650 mg via ORAL
  Filled 2015-11-06 (×14): qty 2

## 2015-11-06 MED ORDER — NALOXONE HCL 0.4 MG/ML IJ SOLN
0.1000 mg | INTRAMUSCULAR | Status: DC | PRN
Start: 2015-11-06 — End: 2015-11-10

## 2015-11-06 MED ORDER — MEASLES, MUMPS & RUBELLA VAC SC INJ
0.50 mL | INJECTION | SUBCUTANEOUS | Status: AC
Start: 2015-11-06 — End: 2015-11-10
  Administered 2015-11-10: 0.5 mL via SUBCUTANEOUS
  Filled 2015-11-06: qty 0.5

## 2015-11-06 MED ORDER — OXYCODONE HCL 10 MG OR TABS
10.0000 mg | ORAL_TABLET | ORAL | Status: DC | PRN
Start: 2015-11-06 — End: 2015-11-06
  Administered 2015-11-06 (×2): 10 mg via ORAL
  Filled 2015-11-06 (×2): qty 1

## 2015-11-06 MED ORDER — DOCUSATE SODIUM 250 MG OR CAPS
250.0000 mg | ORAL_CAPSULE | Freq: Two times a day (BID) | ORAL | Status: DC
Start: 2015-11-06 — End: 2015-11-08
  Administered 2015-11-06 – 2015-11-07 (×2): 250 mg via ORAL
  Filled 2015-11-06 (×3): qty 1

## 2015-11-06 MED ORDER — OXYTOCIN INFUSION 40 UNITS/1000 ML NS (PREMIX)
Status: AC
Start: 2015-11-06 — End: 2015-11-06
  Administered 2015-11-06: 125 mL/h via INTRAVENOUS
  Filled 2015-11-06: qty 1000

## 2015-11-06 MED ORDER — HYDROMORPHONE HCL 1 MG/ML IJ SOLN
0.4000 mg | INTRAMUSCULAR | Status: DC | PRN
Start: 2015-11-06 — End: 2015-11-06
  Administered 2015-11-06: 0.4 mg via INTRAVENOUS
  Filled 2015-11-06: qty 1

## 2015-11-06 MED ORDER — MORPHINE SULFATE 2 MG/ML IJ SOLN
2.00 mg | INTRAMUSCULAR | Status: AC | PRN
Start: 2015-11-06 — End: 2015-11-07

## 2015-11-06 MED ORDER — NALOXONE HCL 0.4 MG/ML IJ SOLN
0.1000 mg | INTRAMUSCULAR | Status: DC | PRN
Start: 2015-11-06 — End: 2015-11-06

## 2015-11-06 MED ORDER — DIPHENHYDRAMINE HCL 50 MG/ML IJ SOLN
INTRAMUSCULAR | Status: DC | PRN
Start: 2015-11-06 — End: 2015-11-06
  Administered 2015-11-06 (×2): 25 mg via INTRAVENOUS

## 2015-11-06 MED ORDER — SENNA 8.6 MG OR TABS
2.0000 | ORAL_TABLET | Freq: Every morning | ORAL | Status: DC
Start: 2015-11-07 — End: 2015-11-08
  Administered 2015-11-07: 17.2 mg via ORAL
  Filled 2015-11-06: qty 2

## 2015-11-06 MED ORDER — OXYCODONE HCL 5 MG OR TABS
5.0000 mg | ORAL_TABLET | ORAL | Status: DC | PRN
Start: 2015-11-06 — End: 2015-11-07

## 2015-11-06 MED ORDER — SODIUM CHLORIDE 0.9 % IV SOLN
500.00 mg | Freq: Once | INTRAVENOUS | Status: AC
Start: 2015-11-06 — End: 2015-11-06
  Administered 2015-11-06: 500 mg via INTRAVENOUS

## 2015-11-06 MED ORDER — SODIUM BICARBONATE 8.4 % IV SOLN
INTRAVENOUS | Status: DC | PRN
Start: 2015-11-06 — End: 2015-11-06
  Administered 2015-11-06 (×4): 5 mL via EPIDURAL

## 2015-11-06 MED ORDER — FENTANYL CITRATE (PF) 100 MCG/2ML IJ SOLN
50.0000 ug | INTRAMUSCULAR | Status: DC | PRN
Start: 2015-11-06 — End: 2015-11-06
  Administered 2015-11-06: 50 ug via INTRAVENOUS

## 2015-11-06 MED ORDER — MEPERIDINE HCL 25 MG/ML IJ SOLN
12.5000 mg | INTRAMUSCULAR | Status: DC | PRN
Start: 2015-11-06 — End: 2015-11-06

## 2015-11-06 MED ORDER — ONDANSETRON HCL 4 MG/2ML IV SOLN
4.0000 mg | Freq: Once | INTRAMUSCULAR | Status: DC | PRN
Start: 2015-11-06 — End: 2015-11-06

## 2015-11-06 MED ORDER — TETANUS-DIPHTH-ACELL PERTUSSIS 5-2.5-18.5 LF-MCG/0.5 IM SUSP
0.5000 mL | INTRAMUSCULAR | Status: DC
Start: 2015-11-06 — End: 2015-11-10

## 2015-11-06 MED ORDER — OXYCODONE HCL 10 MG OR TABS
10.0000 mg | ORAL_TABLET | ORAL | Status: DC | PRN
Start: 2015-11-06 — End: 2015-11-07
  Administered 2015-11-07: 10 mg via ORAL
  Filled 2015-11-06: qty 1

## 2015-11-06 MED ORDER — DIPHENHYDRAMINE HCL 50 MG/ML IJ SOLN
12.5000 mg | Freq: Once | INTRAMUSCULAR | Status: DC | PRN
Start: 2015-11-06 — End: 2015-11-06

## 2015-11-06 MED ORDER — PHENYLEPHRINE DILUTION 100 MCG/ML IJ SOLN
INTRAVENOUS | Status: DC | PRN
Start: 2015-11-05 — End: 2015-11-06
  Administered 2015-11-05 – 2015-11-06 (×7): 100 ug via INTRAVENOUS

## 2015-11-06 MED ORDER — MEPERIDINE HCL 25 MG/ML IJ SOLN
INTRAMUSCULAR | Status: DC | PRN
Start: 2015-11-06 — End: 2015-11-06
  Administered 2015-11-06: 25 mg via INTRAVENOUS
  Administered 2015-11-06: 12.5 mg via INTRAVENOUS

## 2015-11-06 MED ORDER — LACTATED RINGERS IV SOLN
INTRAVENOUS | Status: DC | PRN
Start: 2015-11-06 — End: 2015-11-06
  Administered 2015-11-06: 04:00:00 via INTRAVENOUS

## 2015-11-06 MED ORDER — MISOPROSTOL 200 MCG OR TABS
800.00 ug | ORAL_TABLET | Freq: Once | ORAL | Status: AC
Start: 2015-11-06 — End: 2015-11-06
  Administered 2015-11-06: 800 ug via RECTAL

## 2015-11-06 MED ORDER — FENTANYL CITRATE (PF) 100 MCG/2ML IJ SOLN
INTRAMUSCULAR | Status: AC
Start: 2015-11-06 — End: 2015-11-06
  Administered 2015-11-06: 50 ug via INTRAVENOUS
  Filled 2015-11-06: qty 2

## 2015-11-06 MED ORDER — LABETALOL HCL 5 MG/ML IV SOLN
5.0000 mg | INTRAVENOUS | Status: DC | PRN
Start: 2015-11-06 — End: 2015-11-06

## 2015-11-06 MED ORDER — SIMETHICONE 80 MG OR CHEW
80.0000 mg | CHEWABLE_TABLET | Freq: Three times a day (TID) | ORAL | Status: DC | PRN
Start: 2015-11-06 — End: 2015-11-10
  Administered 2015-11-08: 80 mg via ORAL
  Filled 2015-11-06: qty 1

## 2015-11-06 MED ORDER — MORPHINE SULFATE 2 MG/ML IJ SOLN
2.0000 mg | INTRAMUSCULAR | Status: DC | PRN
Start: 2015-11-06 — End: 2015-11-10

## 2015-11-06 MED ORDER — METOCLOPRAMIDE HCL 5 MG/ML IJ SOLN
INTRAMUSCULAR | Status: DC | PRN
Start: 2015-11-06 — End: 2015-11-06
  Administered 2015-11-06: 10 mg via INTRAVENOUS

## 2015-11-06 MED ORDER — OXYCODONE HCL 5 MG OR TABS
5.0000 mg | ORAL_TABLET | ORAL | Status: DC | PRN
Start: 2015-11-06 — End: 2015-11-06
  Administered 2015-11-06: 5 mg via ORAL
  Filled 2015-11-06: qty 1

## 2015-11-06 MED ORDER — DIPHENHYDRAMINE HCL 25 MG OR TABS OR CAPS CUSTOM
25.0000 mg | ORAL_CAPSULE | Freq: Four times a day (QID) | ORAL | Status: DC | PRN
Start: 2015-11-06 — End: 2015-11-10
  Administered 2015-11-06: 25 mg via ORAL
  Filled 2015-11-06: qty 1

## 2015-11-06 MED ORDER — HYDRALAZINE HCL 20 MG/ML IJ SOLN
10.0000 mg | INTRAMUSCULAR | Status: DC | PRN
Start: 2015-11-06 — End: 2015-11-06

## 2015-11-06 MED ORDER — ONDANSETRON HCL 4 MG/2ML IV SOLN
INTRAMUSCULAR | Status: DC | PRN
Start: 2015-11-06 — End: 2015-11-06
  Administered 2015-11-06: 4 mg via INTRAVENOUS

## 2015-11-06 MED ORDER — MORPHINE SULFATE (PF) 1 MG/ML IJ SOLN
INTRAMUSCULAR | Status: DC | PRN
Start: 2015-11-06 — End: 2015-11-06
  Administered 2015-11-06: 3 mg via EPIDURAL

## 2015-11-06 MED ORDER — CEFAZOLIN SODIUM 1 GM IJ SOLR
INTRAMUSCULAR | Status: DC | PRN
Start: 2015-11-06 — End: 2015-11-06
  Administered 2015-11-06: 1000 mg via INTRAVENOUS

## 2015-11-06 MED ORDER — FENTANYL CITRATE (PF) 100 MCG/2ML IJ SOLN
25.0000 ug | INTRAMUSCULAR | Status: DC | PRN
Start: 2015-11-06 — End: 2015-11-06

## 2015-11-06 MED ORDER — SODIUM CHLORIDE 0.9 % IV SOLN
12.5000 mg | Freq: Once | INTRAVENOUS | Status: DC | PRN
Start: 2015-11-06 — End: 2015-11-06

## 2015-11-06 MED ORDER — FAMOTIDINE 20 MG/2ML IV SOLN
INTRAVENOUS | Status: DC | PRN
Start: 2015-11-06 — End: 2015-11-06
  Administered 2015-11-06: 20 mg via INTRAVENOUS

## 2015-11-06 MED ORDER — KETOROLAC TROMETHAMINE 30 MG/ML IJ SOLN
30.00 mg | Freq: Four times a day (QID) | INTRAMUSCULAR | Status: AC
Start: 2015-11-06 — End: 2015-11-06
  Administered 2015-11-06 (×3): 30 mg via INTRAVENOUS
  Filled 2015-11-06 (×3): qty 1

## 2015-11-06 MED ORDER — OXYTOCIN INFUSION 40 UNITS/1000 ML NS (PREMIX)
Status: DC
Start: 2015-11-06 — End: 2015-11-08
  Administered 2015-11-06: 125 mL/h via INTRAVENOUS

## 2015-11-06 MED ORDER — KETOROLAC TROMETHAMINE 30 MG/ML IJ SOLN
INTRAMUSCULAR | Status: DC | PRN
Start: 2015-11-06 — End: 2015-11-06
  Administered 2015-11-06: 30 mg via INTRAVENOUS

## 2015-11-06 MED ORDER — EPHEDRINE SULFATE 50 MG/ML IJ SOLN
INTRAMUSCULAR | Status: DC | PRN
Start: 2015-11-05 — End: 2015-11-06
  Administered 2015-11-05: 5 mg via INTRAVENOUS

## 2015-11-06 MED ORDER — MAGNESIUM HYDROXIDE 400 MG/5ML OR SUSP
30.0000 mL | Freq: Every evening | ORAL | Status: DC | PRN
Start: 2015-11-06 — End: 2015-11-10

## 2015-11-06 SURGICAL SUPPLY — 50 items
APPLICATOR CHLORAPREP 26ML, ~~LOC~~ (Misc Surgical Supply) ×2
BAG INFANT RESUSCITATION DISP (Anesthesia Supply)
BENZOIN TINCTURE AMPULE STERILE (Misc Medical Supply)
BLADE SURGEON #10 STERILE (Knives/Blades)
BLADE SURGEON #15 STERILE (Knives/Blades) IMPLANT
CANISTER SUCTION 1200CC (Misc Medical Supply) IMPLANT
CLAMP CORD SINGLE STERILE (Misc Medical Supply)
CLIPPER BLADE ASSY FOR 9670 CLIPPER (Knives/Blades) ×2
CONTAINER PATH 64 OZ W/LID (Misc Medical Supply) ×2 IMPLANT
COVER LIGHT HANDLE RIGID (2 PACK) (Misc Surgical Supply) ×2 IMPLANT
DRAPE HALF SHEET MEDIUM (Drapes/towels) ×2 IMPLANT
DRESSING MEPILEX BORDER SILICONE 4" X 10" (Dressings/packing) ×2
DRESSING SPONGE SOFT 4X4 (Dressings/packing)
DRESSING TELFA STRL 3" X 8" (Dressings/packing)
GLOVE BIOGEL INDICATOR UNDERGLOVE SIZE 6.5 (Gloves/gowns) ×2
GLOVE BIOGEL INDICATOR UNDERGLOVE SIZE 7 (Gloves/gowns) ×4 IMPLANT
GLOVE BIOGEL PI ULTRATOUCH SIZE 6.5 (Gloves/gowns) ×2 IMPLANT
GLOVE SURGEON BIOGEL SIZE 7 (Gloves/gowns) ×4
GLOVE SURGEON BIOGEL SIZE 7.5 (Gloves/gowns) ×2 IMPLANT
GLOVE SURGICAL BIOGEL SIZE 6 (Gloves/gowns) ×2 IMPLANT
GLOVE SURGICAL BIOGEL SIZE 6.5 (Gloves/gowns) ×8
GOWN SURGICAL ULTRA FABRIC- REINFORCED, XL BLUE (Gloves/gowns) ×4
KIT BLD GAS 3CC 22GX1 SAFETY (Kits/Sets/Trays) IMPLANT
MANIFOLD 4 PORT STANDARD (Misc Medical Supply) ×1
MANIFOLD 4 PORT STANDARD, NEPTUNE SUCTION (Misc Medical Supply) ×1 IMPLANT
MASK INFANT SEALFLEX (Anesthesia Supply) IMPLANT
PACK C-SECTN BIRTH W/DRAPE (Procedure Packs/kits) ×2 IMPLANT
PAD GROUND VALLEYLAB REM ADULT E7507 (Misc Surgical Supply) ×2 IMPLANT
PEN MARKER II SECURLINE BLACK (Misc Medical Supply)
PENCIL ELECTROSURG W/BL&CTRL (Cautery) IMPLANT
PREP TRAY WET SKIN SCRUB KENDALL (Misc Surgical Supply) IMPLANT
SKIN PREP -BATH CLOTH CHG 2% (Misc Medical Supply) IMPLANT
SLEEVE SCD KNEE LG (Misc Medical Supply)
SLEEVE SCD KNEE MEDIUM (Misc Medical Supply) ×2
SOLUTION IRR POUR BTL 0.9% NS 1000ML (Non-Pharmacy Meds/Solutions) ×2 IMPLANT
SOLUTION IRR POUR BTL H20 1000ML (Non-Pharmacy Meds/Solutions) ×2 IMPLANT
SPONGE LAP RF DETECT 18" X 18" XRAY STERILE (Dressings/packing) IMPLANT
STAPLER PROXIMATE SKIN 35 WIDE (Staplers and staple reloads)
STRIP MEDI-STRIP SKIN CLOSURE 1/2 X 4" (Dressings/packing) ×1 IMPLANT
STRIP SKIN CLOSURE 1/2 X 4 (Dressings/packing) ×1
SUTURE CHROMIC GUT 0 36" CT 914 (Suture) IMPLANT
SUTURE MONOCRYL PLUS 4-0 27" PS-2 MCP426 (Suture) ×2 IMPLANT
SUTURE VICRYL 2-0 CT-1 36" J945 (Suture) ×2 IMPLANT
SUTURE VICRYL 4-0 27" KS (Suture) IMPLANT
SUTURE VICRYL PLUS 0 36" CT-1 VCP946 (Suture) ×4
SUTURE VICRYL PLUS 0 36" CTX-B VCPB978 (Suture) ×4
SUTURE VICRYL PLUS 2-0 27" CT-1 VCPP42D (Suture)
TOWEL BLUE STERILE DISPOSABLE (Drapes/towels) IMPLANT
TOWELS OR BLUE 4-PACK STERILE, DISPOSABLE (Drapes/towels) ×4 IMPLANT
TRAY FOLEY SURESTEP LUBRI-SIL I.C.16FR URIMETER, LF (Lines/Drains) ×2 IMPLANT

## 2015-11-06 NOTE — Interdisciplinary (Signed)
Fundus firm 1 cm below the umbilicus. Lochia rubra, moderate. No continuous bleeding, small gushes during fundal exam. No clots noted. Dr. Heide GuileSwor notified, order for 40 units Oxytocin. Heart rate up to 103, patient previously hypotensive, BPs WNL. Will continue to monitor.

## 2015-11-06 NOTE — Interdisciplinary (Signed)
Dr. Thayer DallasLederhandler at bedside to evaluate vaginal bleeding.

## 2015-11-06 NOTE — Interdisciplinary (Signed)
11/06/15 0630   Vital Signs   Observations clots noted on pad with gush of blood with fundal check. resident paged to bedside. pt evaluated. ultrasound performed.  clots manually extracted. miso 800 mcg rectal ordered and given. will continue to evaluate.

## 2015-11-06 NOTE — Discharge Summary (Signed)
Postpartum Discharge Summary    Admission Date: 11/05/2015     Discharge Date: 11/10/2015    Patient Name: Lauren Booth     Principal Diagnosis (required): Intrauterine pregnancy     Hospital Problem List (required):  Active Hospital Problems    Diagnosis   . *Status post primary low transverse cesarean section [Z98.891]   . Encounter for supervision of normal first pregnancy in second trimester [Z34.02]   . Labor and delivery indication for care or intervention [O75.9]   . Maternal care for fetal decelerations during pregnancy [O76]   . Hemorrhoids during pregnancy [O22.40]   . Rubella non-immune status, antepartum [O99.89, Z28.3]   . Chlamydia [A74.9]   . Stress due to marital problems [Z63.0]   . Hx of breast implants, bilateral [Z98.82]      Resolved Hospital Problems    Diagnosis   No resolved problems to display.        Principal Procedure During This Hospitalization (required):   Primary low transverse Cesarean section     Other Procedures Performed During This Hospitalization (required):  None    Consultations Obtained During This Hospitalization:  Anesthesia Critical Care  Neonatology    Reason for Admission to the Hospital / History of Present Illness:  The patient is a 30 year old G2P0010 female admitted to the midwife service for induction of labor for fetal decelerations.     Hospital Course (required):  30 year old G2P0010 at [redacted]w[redacted]d admitted to the midwife service for induction of labor for fetal decelerations. Her induction was started with pitocin, and she had intermittent small decelerations. She had a prolonged deceleration requiring terbutaline following AROM with thick meconium-stained fluid noted. She was managed expectantly thereafter with recurrent variable decelerations, but then had another prolonged deceleration requiring terbutaline. Her cervix was 5.5 cm dilated at that time. Given non-reassuring status remote from delivery, primary Cesarean section was both requested by the  patient and recommended by the MD team.     She delivered a   Female  at   via C-Section, Low Transverse  at 11/06/2015  4:22 AM .  Weight: 2865 g (6 lb 5.1 oz) .  Apgars: 1 min 8 ; 5 min 9   Laceration type:  Degree: None     She used the following for pain relief during labor: epidural anesthesia.     Labor complications: Fetal decelerations, meconium staining, Cesarean section     Antepartum issues:   #. Chlamyia infection in pregnancy   #. Rubella non-immune  #. Hemorrhoids     Post-operative course:   #. Post-operative day 4: The patient was meeting all milestones. Tolerating PO, ambulating, passing flatus, voiding spontaneously and had good pain control.   -- Encourage ambulation  -- EBL 930mL, Hgb 13.5->10,2->10.8, stable with no indication for repeat labs at this time.     #. Lower extremity edema, initially left worse than right and with tenderness in her left calf. The patients vitals were stable, she was sating well on room air, and was non tachycardic. She denied any chest pain or shortness of breath. LE extremity dopplers were orders due to patient being low mobility and in the postpartum period and they were negative. At the time of discharge the edema had greatly improved and the tenderness had resolved.     #. Antepartum EDS: 18  - PP EDS 9, both negative for thoughts of self harm.  - SW was consulted and reported that patient appears to be coping well   and has great family support. The patient was provided with resources for post partum depression should she need them as well as information regarding WIC and [employer].     #. Breast Feeding: yes  #. Birth Control Method: would like copper IUD at 6wk pp visit   #. Prenatal labs: O POS, HIV neg, RPR neg, varicella immune, HepBsAg NR, GC/CT neg, quant neg, rubella nonimmune      The patient is feeding Breastmilk.   Her contraception plan consists of IUD/IUS.  Immunizations given post-partum:  MMR and influenza.  The patient was instructed to maintain  pelvic rest for six weeks -- including no tampon insertion, douching, or sexual intercourse. She was informed that she may shower as usual. She was instructed to return to the Emergency Department or to contact her healthcare provider immediately for any symptoms of nausea, vomiting, fevers, wound separation, wound drainage, heavy vaginal bleeding, or pain.    Discharge Condition (required): Recovering post-partum.    Discharge Diet: Regular    Discharge Medications:     What To Do With Your Medications      START taking these medications       Add'l Info    HYDROcodone-acetaminophen 5-325 MG tablet   Commonly known as:  NORCO   Take 1 tablet by mouth every 6 hours as needed for Moderate Pain (Pain Score 4-6).    Quantity:  30 tablet   Refills:  0       ibuprofen 600 MG tablet   Commonly known as:  MOTRIN   Take 1 tablet (600 mg) by mouth every 6 hours.    Quantity:  30 tablet   Refills:  0       senna 8.6 MG tablet   Commonly known as:  SENOKOT   Take 2 tablets (17.2 mg) by mouth daily as needed for Constipation.    Quantity:  30 tablet   Refills:  0       simethicone 80 MG chewable tablet   Commonly known as:  MYLICON   Take 1 tablet (80 mg) by mouth 3 times daily as needed for Flatulence (Gas pain).    Quantity:  30 tablet   Refills:  0         CHANGE how you take these medications       Add'l Info    acetaminophen 325 MG tablet   Commonly known as:  TYLENOL   Take 2 tablets (650 mg) by mouth every 6 hours.    Quantity:  30 tablet   Refills:  0   What changed:    - medication strength  - how much to take  - when to take this  - reasons to take this         CONTINUE taking these medications       Add'l Info    cetirizine 10 MG tablet   Commonly known as:  ZYRTEC    Refills:  1       loratadine 10 MG tablet   Commonly known as:  CLARITIN   Take 10 mg by mouth daily.    Refills:  0       PRENATAL 1 PO    Refills:  0         STOP taking these medications          hydrocortisone 1 % cream            Where to Get Your  Medications  These medications were sent to [facility]  [id number], [place] [address]    Hours:  Mon-Fri 8:30am-7:00pm, Sat-Sun 9am-5:00pm Phone:  [phone]    . acetaminophen 325 MG tablet   . ibuprofen 600 MG tablet   . senna 8.6 MG tablet   . simethicone 80 MG chewable tablet         Please check with staff for printed prescription or if prescription was faxed to your pharmacy.     Bring a paper prescription for each of these medications    . HYDROcodone-acetaminophen 5-325 MG tablet             No Known Allergies    Discharge Disposition: Home.    Follow Up Appointments:  The patient will follow up in four to six weeks with [place] or her prenatal care provider.Marland Kitchen  Appointment scheduled.     Future Appointments  Date Time Provider [place]   12/21/2015 10:00 AM Timpe, Tanya Nones, CNM [facility]       Discharging Provider's Contact Information:   [facility] operator at [phone]

## 2015-11-06 NOTE — Interdisciplinary (Signed)
[facility]. Report given to Alphonzo Dublinhristina Osborne RN. Patient care relinquished.     Milly JakobAshley Chariti Havel, RN

## 2015-11-06 NOTE — Interdisciplinary (Signed)
SW Brief Note:    CSW attempted to evaluate Lauren Booth for PPD; however, primary RN suggested waiting until a future day as Pt is sleeping s/p C-section early this morning. CSW will handoff to weekend SW to follow-up on Sunday.    Maretta LosFelecia Gaines, LCSW  Clinical Social Worker, III

## 2015-11-06 NOTE — Progress Notes (Addendum)
Called to room for decel to 60s beginning with onset of amnioinfusion. When I arrived, baby had been down for 4 mins, RNs attempting position changes. SVE 5.5/C/0, unchanged from prior. Decel resolved with scalp stim, though HR continued to fluctuate between 100s-120s before returning to previous baseline. Terbutaline given.     After this prolonged deceleration, patient requesting primary C-section due to concerns regarding the baby. Given recurrent decelerations remote from delivery, we would also recommend C-section at present.     Patient consented previously for CS, consent reviewed. All questions answered. Anesthesia and attending notified.     Truett PernaErin Colleen Marcayla Budge, MD  Signature Derived From Controlled Access Password, November 06, 2015, 3:36 AM

## 2015-11-06 NOTE — Anesthesia Postprocedure Evaluation (Signed)
Anesthesia Transfer of Care Note    Patient: Lauren Booth    Procedures performed: Procedure(s):  DELIVERY, CESAREAN    Vital signs: stable           Anesthesia Post Note    Patient: Lauren Booth    Procedure(s) Performed: Procedure(s):  DELIVERY, CESAREAN      Final anesthesia type:Epidural    Patient location: L&D    Post anesthesia pain: adequate analgesia    Post assessment: no apparent anesthetic complications and tolerated procedure well    Mental status: awake, alert  and oriented    Airway Patent: Yes    Last Vitals:   Vitals:    11/06/15 0714   BP:    Pulse: 94   Resp: 22   Temp:    SpO2:        Post vital signs: stable    Hydration: adequate    N/V:no    Anesthetic complications: no    Disposal of controlled substances: All controlled substances during the case accounted for and disposed of per hospital policy    Plan of care per primary team.

## 2015-11-06 NOTE — Progress Notes (Signed)
CNM LABOR PROGRESS NOTE    S:  Pt comfortable with CLE. Pt's M&S are in the room, concerned about possible cesarean section.    O:    BP (!) 89/54  Pulse 86  Temp 98.1 F (36.7 C)  Resp 18  Ht 5\' 6"  (1.676 m)  Wt 71.2 kg (157 lb)  LMP 01/21/2015 (Exact Date)  SpO2 100%  BMI 25.34 kg/m2    FHT's: 145  bpm with accels.  Moderate variability.  Variable decels, occas late decels.  Pos accels.  Boswell's: Q 2-3 mins x 75-85 mm/hg x 70-90 secs.  IUPC B-L 15-20 mm/hg.  190 MVU's.  SVE: 5.5/C/0 Vtx (ROP) confirmed w/U/S.  Foley to gravity-draining clear yellow urine.  Pitocin on 3 mu/min  U/S done for AFI before amnioinfusion started.  AFI 1.89 cms.    A:  Lauren Booth is a 30 year old female with G2P0010 at 5315w2d w/variable/late decels.  FHR Tracing Cat II.  Prolonged latent phase of labor.  Thick mec.  Adequate MVU's.  No cervical change since 1900 and no change in station since 2130.    P:  1)  Con't to monitor maternal/fetal status.  2)  Con't to provide labor support.  3)  IUPC placed w/above SVE for amnioinfusion after d/w Dr. Heide GuileSwor. 500 ml infusion of NS over the next hour then 100 ml/hr.  RN to watch for fluid return.  4)  Con't to maintain adequate MVU's w/Pitocin augmentation per protocol.  5)  SVE in 2 hrs and PRN.  6)  Anticipate active labor.  7)  If FHR tracing becomes a Cat III, will transfer pt to MD service for cesarean section.    Achille Richebecca C. Garrett-Brown, CNM [phone]#[id number]

## 2015-11-06 NOTE — Interdisciplinary (Signed)
11/06/15 1810   Vital Signs   Temperature 98.7 F (37.1 C)   Temp source Oral   Heart Rate 88   Source Monitor   Respirations 18   Blood pressure (BP) (!) 89/52   MAP (mmHg) 62   BP Source Monitor   BP Location Right arm   BP Patient Position Semi-Fowlers   Self Report   Pain Score 4       MD notified at this time of pt complaining of lightheadedness. Vitals stable, bleeding scant, fundus firm. Will continue to monitor.

## 2015-11-06 NOTE — Progress Notes (Signed)
Amnioinfusion was started and pt had another 5-6 minute deceleration with slow recovery to B-L.  Pt repositioned onto her L side.  Amnioinfusion stopped, Pitocin turned off, terb given 0.25 mg SQ and pt prepared for a cesarean birth.  Dr. Heide GuileSwor in the room.  Pt transferred to MD care.  Achille Richebecca C. Garrett-Brown, CNM [id number]

## 2015-11-06 NOTE — Progress Notes (Addendum)
Called to evaluate patient for small gush of blood with fundal massage, slightly above that expected.     Patient feeling well, just tired from surgery.     Vitals not recorded in EPIC but reviewed with RN: BPs 80/50s - 100s/70s (have been borderline low like this since CLE placement), HR 80s-90s, O2 sats 100%, afebrile  Abd soft  Side plate sized red area on chucks, no significant clots  Bedside ultrasound performed, clots visualized in LUS. LUS sweep performed with ~ 50-100 cc dark clots extracted. Repeat bedside ultrasound with thinner stripe, no visualized clots, no color flow over stripe.   Further bleeding scant on fundal massage.   Miso 800 mg placed rectally.   Continue close monitoring    Truett PernaErin Colleen Swor, MD  Signature Derived From Controlled Access Password, November 06, 2015, 7:29 AM      R3 Addendum    Assessed patient again at bedside for trickling with fundal, slightly more than expected per RN.   Vitals within normal limits and reassuring.  Fundus firm 2 cm below umbilicus.  Mild dark trickling.   Continue to monitor closely on L&D with continued fundal exams.   Double pitocin.    Sharyn CreamerSarah Lederhandler, MD [id number]  Resident Physician, PGY3  Department of Obstetrics and Gynecology  11/06/15 7:55 AM

## 2015-11-06 NOTE — Interdisciplinary (Signed)
Fundus firm 1 cm below umbilicus. Lochia rubra scant, no clots. Heart rate occasionally tachycardic up to 107 bpm, usually 80s-90s. BP, temp, RR and O2 sats WNL. Urine output WNL. Discussed findings with Dr. Thayer DallasLederhandler, okay to transfer for postpartum unit at this time.

## 2015-11-06 NOTE — Interdisciplinary (Signed)
Paged OB intern for medication for itching. Benadryl not helping that much and pt itching a lot.

## 2015-11-06 NOTE — Op Note (Signed)
OPERATIVE NOTE    PREOPERATIVE DIAGNOSIS: IUP at 4555w2d, non reassuring fetal status    POSTOPERATIVE DIAGNOSIS: same    PROCEDURE INFORMATION:  Procedure(s):  DELIVERY, CESAREAN    ATTENDING SURGEON:   Surgeon(s) and Role:     Jaynie Crumble* Voris Tigert B., MD - Primary     * Driebe, Amy Honor JunesMarie Whistler, MD - Resident - Assisting     * Swor, Orland MustardErin Colleen, MD - Resident - Assisting    ANESTHESIA: CLE    FINDINGS: viable female infant, APGARS 548,9. Meconium stained amniotic fluid and placenta. Normal appearing uterus, tubes and ovaries.    SPECIMENS: none    Fluids/Blood Products:      IV Fluids: 800 cc    Blood Products: none    EBL: QBL 930 cc    Urine Output: 50 cc    COMPLICATIONS: none    DISPOSITION: to recovery room    INDICATIONS: Patient is 30 year old G2P0010 at 3155w2d admitted to the midwife service for induction of labor for fetal decelerations. Her induction was started with pitocin, and she had intermittent small decelerations. She had a prolonged deceleration requiring terbutaline following AROM with thick meconium-stained fluid noted. She was managed expectantly thereafter with recurrent variable decelerations, but then had another prolonged deceleration requiring terbutaline. Her cervix was 5.5 cm dilated at that time. Given non-reassuring status remote from delivery, primary Cesarean section was both requested by the patient and recommended by the MD team. Consents were reviewed.     DESCRIPTION OF PROCEDURE: The patient was taken to the operating room where appropriate measures were taken to identify her via standard operating room time out. Her epidural anesthesia was bolused and found to be adequate. The patient was prepped and draped in the normal sterile fashion in the dorsal supine position with a leftward tilt. Adequacy of anesthesia was tested and confirmed with an Allis clamp. One gram of Ancef and 500 mg of Azithromycin were given prior to skin incision.     A Pfannenstiel skin incision was  then made with the scalpel and carried through to the underlying layer of fascia which was scored on either side of the midline. The fascial incision was extended bluntly with fingers. The rectus muscles were then separated in the midline and the peritoneum was identified and entered bluntly. The peritoneal incision was then extended superiorly and inferiorly with good visualization of the bladder using stretch. The bladder blade was then inserted and the vesicouterine peritoneum was identified, grasped with the pickups, and entered sharply with Metzenbaum scissors. The incision was then extended laterally and a bladder flap was created digitally.    The bladder blade was then reinserted, and the lower uterine segment was incised in a transverse fashion with the scalpel. The uterine incision was then extended laterally using stretch in the cephalad and caudad direction. The bladder blade was removed, and the infant's head and body were delivered atraumatically. Cord clamped and cut, and the infant was handed off to the waiting pediatricians. Cord gases were sent.    Arterial: pH 7.23/57/16/23.9, Venous: pH 7.32/40/23/21, BE -5    The placenta was then removed manually. The membranes were meconium-stained and adherent, and were removed carefully with ring forceps. Multiple passes with the laparotomy sponges were taken to ensure no membranes were remaining in the uterus. The uterus was exteriorized and cleared of all clot and debris. A 1 cm extension was noted laterally at the left aspect of the incision. Starting from the extension, the  uterine incision was repaired with 0 Vicryl in a running, locked fashion.  A second layer of 0 Vicryl suture was used to create an imbricating layer.  The uterus was then returned to the abdomen. The gutters were cleared of all clots. The uterine incision was then reinspected and found to be hemostatic.      The subfascial space was then inspected for any undetected bleeding; none was  noted. The fascia was reapproximated with a running suture of 0 Vicryl suture. This layer was then irrigated using normal saline and made hemostatic using the Bovie cautery. Interrupted subcutaneous sutures were used to reapproximate the subcutaneous tissue.  The skin was then closed with 4-0 Monocryl.     The patient tolerated the procedure well. Sponge, lap, and needle counts were correct x3 and radiofrequency scanning was correct x2. The patient was taken to the recovery room in stable condition.    Dr. Hyacinth Meeker was present, scrubbed, and attending throughout the entire procedure.    Orland Mustard Swor, PGY2      I was scrubbed for the procedure as documented.  Auden Wettstein B. Hyacinth Meeker

## 2015-11-06 NOTE — Interdisciplinary (Signed)
Report received from Rinaldo Cloudoxana S. RN.  Patient care assumed.    Milly JakobAshley Geniene List, RN

## 2015-11-06 NOTE — Brief Op Note (Signed)
BRIEF OPERATIVE NOTE    DATE: 11/06/2015  TIME: 4:48 AM    PREOPERATIVE DIAGNOSIS: IUP at 679w2d, non reassuring fetal status    POSTOPERATIVE DIAGNOSIS: same    PROCEDURE INFORMATION:  Procedure(s):  DELIVERY, CESAREAN    ATTENDING SURGEON:   Surgeon(s) and Role:     Jaynie Crumble* Elverda Wendel B., MD - Primary     * Driebe, Amy Honor JunesMarie Whistler, MD - Resident - Assisting     * Swor, Orland MustardErin Colleen, MD - Resident - Assisting    ANESTHESIA: CLE    FINDINGS: viable female infant, APGARS 348,9. Meconium stained amniotic fluid and placenta. Normal appearing uterus, tubes and ovaries.    SPECIMENS: none    Fluids/Blood Products:      IV Fluids: 800 cc    Blood Products: none    EBL: QBL pending    Urine Output: 50 cc    COMPLICATIONS: none    DISPOSITION: to recovery room    Karthik Whittinghill B. Hyacinth MeekerMiller

## 2015-11-07 ENCOUNTER — Encounter (HOSPITAL_COMMUNITY): Payer: Self-pay | Admitting: Certified Nurse Midwife

## 2015-11-07 LAB — HEMOGRAM, BLOOD
Hct: 31.2 % — ABNORMAL LOW (ref 34.0–45.0)
Hgb: 10.8 gm/dL — ABNORMAL LOW (ref 11.2–15.7)
MCH: 30.6 pg (ref 26.0–32.0)
MCHC: 34.6 g/dL (ref 32.0–36.0)
MCV: 88.4 um3 (ref 79.0–95.0)
MPV: 11.5 fL (ref 9.4–12.4)
Plt Count: 174 10*3/uL (ref 140–370)
RBC: 3.53 10*6/uL — ABNORMAL LOW (ref 3.90–5.20)
RDW: 13.8 % (ref 12.0–14.0)
WBC: 21.4 10*3/uL — ABNORMAL HIGH (ref 4.0–10.0)

## 2015-11-07 MED ORDER — OXYCODONE HCL 5 MG OR TABS
5.0000 mg | ORAL_TABLET | ORAL | Status: DC | PRN
Start: 2015-11-07 — End: 2015-11-10
  Administered 2015-11-07: 5 mg via ORAL

## 2015-11-07 MED ORDER — ACETAMINOPHEN 325 MG PO TABS
650.0000 mg | ORAL_TABLET | Freq: Four times a day (QID) | ORAL | Status: DC | PRN
Start: 2015-11-07 — End: 2015-11-10
  Administered 2015-11-07 – 2015-11-08 (×2): 650 mg via ORAL
  Filled 2015-11-07 (×2): qty 2

## 2015-11-07 MED ORDER — OXYCODONE HCL 10 MG OR TABS
10.0000 mg | ORAL_TABLET | ORAL | Status: DC | PRN
Start: 2015-11-07 — End: 2015-11-07

## 2015-11-07 MED ORDER — IBUPROFEN 600 MG OR TABS
600.0000 mg | ORAL_TABLET | Freq: Four times a day (QID) | ORAL | Status: DC
Start: 2015-11-07 — End: 2015-11-10
  Administered 2015-11-07 – 2015-11-10 (×15): 600 mg via ORAL
  Filled 2015-11-07 (×15): qty 1

## 2015-11-07 MED ORDER — OXYCODONE HCL 10 MG OR TABS
10.0000 mg | ORAL_TABLET | ORAL | Status: DC | PRN
Start: 2015-11-07 — End: 2015-11-10
  Administered 2015-11-07 – 2015-11-10 (×16): 10 mg via ORAL
  Filled 2015-11-07 (×16): qty 1

## 2015-11-07 MED ORDER — OXYCODONE HCL 5 MG OR TABS
5.0000 mg | ORAL_TABLET | ORAL | Status: DC | PRN
Start: 2015-11-07 — End: 2015-11-07

## 2015-11-07 MED ORDER — IBUPROFEN 600 MG OR TABS
600.0000 mg | ORAL_TABLET | Freq: Four times a day (QID) | ORAL | Status: DC
Start: 2015-11-07 — End: 2015-11-07

## 2015-11-07 MED ORDER — OXYCODONE HCL 5 MG OR TABS
5.0000 mg | ORAL_TABLET | ORAL | Status: DC | PRN
Start: 2015-11-07 — End: 2015-11-10
  Filled 2015-11-07: qty 1

## 2015-11-07 MED ORDER — ACETAMINOPHEN 325 MG PO TABS
650.0000 mg | ORAL_TABLET | Freq: Four times a day (QID) | ORAL | Status: DC | PRN
Start: 2015-11-07 — End: 2015-11-07

## 2015-11-07 NOTE — Plan of Care (Signed)
Problem: Anxiety  Goal: Alleviation of anxiety  Outcome: Progressing toward goal, anticipate improvement over: next 12-24 hours  Goal: Knowledge of positive coping patterns  Outcome: Progressing toward goal, anticipate improvement over: next 12-24 hours    Problem: Deep Venous Thrombosis - Risk of  Goal: Absence of deep venous thrombosis  For patients with risks consider intermittent compression device, early ambulation, prophylaxis with low molecular weight heparin or intravenous/subcutaneous heparin.   Outcome: Goal Met    Problem: Fluid Volume Imbalance, Risk of  Goal: Absence of postpartum hemorrhage  Medication management may include oxytocin (for prevention, also), Methergine (avoid if hypertensive), prostaglandin.   Outcome: Goal Met    Problem: Urinary Retention  Goal: Absence of postvoid residual  Outcome: Goal Met

## 2015-11-07 NOTE — Plan of Care (Signed)
Problem: Anxiety  Goal: Alleviation of anxiety  Outcome: Progressing toward goal, anticipate improvement over: next 12-24 hours  Goal: Knowledge of positive coping patterns  Outcome: Goal Met    Problem: Deep Venous Thrombosis - Risk of  Goal: Absence of deep venous thrombosis  For patients with risks consider intermittent compression device, early ambulation, prophylaxis with low molecular weight heparin or intravenous/subcutaneous heparin.   Outcome: Progressing toward goal, anticipate improvement over: next 12-24 hours    Problem: Fluid Volume Imbalance, Risk of  Goal: Balanced intake and output  Outcome: Goal Met  Goal: Absence of postpartum hemorrhage  Medication management may include oxytocin (for prevention, also), Methergine (avoid if hypertensive), prostaglandin.   Outcome: Goal Met    Problem: Urinary Retention  Goal: Able to void spontaneously  Outcome: Progressing toward goal, anticipate improvement over: next 12-24 hours  Goal: Absence of postvoid residual  Outcome: Progressing toward goal, anticipate improvement over: Next 24-48 hours

## 2015-11-07 NOTE — Plan of Care (Signed)
Problem: Anxiety  Goal: Alleviation of anxiety  Outcome: Progressing toward goal, anticipate improvement over: Next 24-48 hours  Continue to assess. Keep pt informed and have pt actively participate in care. Encourage pt to verbalize feelings and concerns.

## 2015-11-07 NOTE — Plan of Care (Signed)
Problem: Deep Venous Thrombosis - Risk of  Goal: Absence of deep venous thrombosis  For patients with risks consider intermittent compression device, early ambulation, prophylaxis with low molecular weight heparin or intravenous/subcutaneous heparin.   Outcome: Progressing toward goal, anticipate improvement over: next 12-24 hours  Continue to assess. Encourage pt to ambulate frequently. Encourage pt to wear compression device when in bed.

## 2015-11-07 NOTE — Progress Notes (Addendum)
POSTPARTUM NOTE  Lauren Booth is a 30 year old G2P1011 currently POD1 s/p  pLTCS at 1177w2d for NRFHT following IOL for fetal decelerations at term.     Pregnancy Significant For:   - late transfer [place]     Method of Delivery: pLTCS  Estimated Blood Loss: 930cc    Date of birth: 11/06/2014 Time of birth: 4:22 AM Gestational Age:29 2/7     SUBJECTIVE  Patient reports significant cramping pain not well controlled by her current pain meds. Otherwise feels well, tolerating clears without n/v.     Ambulation: yes, minimally  Void Spontaneous: no, foley in place  Pain Score: 8  Lochia: minimal  Breastfeeding: yes  Diet: Diet Clear Liquid  Diet Regular  Flatus: yes  Other: Denies shortness of breath, dizziness, headache, vision changes, right upper quadrant pain.    OBJECTIVE     11/06/15  1653 11/06/15  1810 11/06/15  2017 11/07/15  0450   BP: 93/55 (!) 89/52 93/53 90/62    Pulse: 96 88  80   Resp: 18 18 18 18    Temp: 98.3 F (36.8 C) 98.7 F (37.1 C) 98.8 F (37.1 C) 98.1 F (36.7 C)   SpO2:           General: NAD  Heart: normal rate, regular rhythm; no murmurs, rubs, or gallops  Lungs: clear to ascultation; no increased work of breathing  Abdomen: Positive bowel sounds, appropriately tender  Fundus: firm fundus at umbilicus  Incision: mepilex removed, clean/dry/intact without erythema or drainage  Perineum: no swelling  Extremities: trace edema    Medications:  PRNs in past 12hrs  Tylenol x 1  Oxy 10mg  x1    UOP: 56700mL/2 hrs = 250 mL/hr    LABS  Lab Results   Component Value Date    WBC 20.2 11/06/2015    RBC 3.29 11/06/2015    HGB 10.2 11/06/2015    HCT 29.4 11/06/2015    MCV 89.4 11/06/2015    MCHC 34.7 11/06/2015    RDW 13.7 11/06/2015    PLT 147 11/06/2015    MPV 11.8 11/06/2015       Antepartum EDS: 18  Postpartum EDS: ordered    ASSESSMENT/PLAN  Lauren Booth is a 30 year old G2P1011 currently POD1 s/p  pLTCS at 5377w2d for NRFHT following IOL for fetal decelerations at term. d.     #. Post-partum/operative day 1: not yet meeting milestones  -- d/c foley, f/u TOV  -- ADAT, encourage ambulation    #. Antepartum EDS: 18  - PP EDS pending, SW consult pending due to concern with FOB    #. Breast Feeding: yes  #. Birth Control Method: discussion deferred  #. Prenatal labs: O POS, HIV neg, diabetes neg, rubella imm, GBS neg  #. Health Care Maintenance: Influenza will offere, Tdap 09/22/15  #. Disposition: Anticipate discharge home day 3-4  #. Follow up: 6 week    Wendee BeaversKelsey Pinson, MD PID [id number]  Resident Physician PGY-1  Reproductive Medicine   Pager: [phone]    5:27 AM 11/07/15          Attending  POD 1 pLTCS for non-reassuring fetal status.  Patient seen.  Reports cramping pain.  Ambulating minimally.  Foley in place.  AVSS  Exam appropriate.  Post op Hb 10.2    Overall stable.  SW consulted.  Repeat CBC today.  Routine post op care as outlined.  Plan reviewed with residents.    Sheilah Minshristine Fredis Malkiewicz MD

## 2015-11-07 NOTE — Plan of Care (Signed)
Problem: Fluid Volume Imbalance, Risk of  Goal: Balanced intake and output  Outcome: Progressing toward goal, anticipate improvement over: next 12-24 hours  Continue to assess. Encourage pt to void every 2-3 hours and stay hydrated.

## 2015-11-07 NOTE — Plan of Care (Signed)
Problem: Urinary Retention  Goal: Able to void spontaneously  Outcome: Progressing toward goal, anticipate improvement over: next 12-24 hours  Continue to assess. Encourage pt to void every 2-3 hours.

## 2015-11-07 NOTE — Lactation Note (Signed)
This note was copied from a baby's chart.  Lactation Note    Reason for Consult: Breastfeeding session, Primigravida    Breastfeeding experience: No    Maternal Risk Factors: Risk for delayed lactogenesis   Unplanned or emergency C-Section  Infant Risk Factors: Term    Preferred language: English     Mother's Name:  Lauren Booth, Lauren Booth  29 year old    Mother's MRN: [id number]   Obstetric History    G2   P1   T1   P0   A1   L1     SAB1   TAB0   Ectopic0   Multiple0   Live Births1    Obstetric Comments   2017 Delivery.  Pt sent over from [facility] for a deceleration.  Thick mec was noted w/AROM.  Terb x 2 in labor for NR FHR tracing at 5.5/C/0 (ROP).  Primary C/S done.  Nuchal/Body cord.     The infant was born via C-Section, Low Transverse  Mom's problem list:   Patient Active Problem List   Diagnosis   . Encounter for supervision of normal first pregnancy in second trimester   . Stress due to marital problems   . Hx of breast implants, bilateral   . Chlamydia   . Rubella non-immune status, antepartum   . Hemorrhoids during pregnancy   . Screening for cervical cancer   . Maternal care for fetal decelerations during pregnancy   . Labor and delivery indication for care or intervention   . Status post primary low transverse cesarean section     Baby Information:  Hours of life: 2 days  GA: 41 2/7 weeks  Birth Weight: 101.06  Birth weight change: -2%   Baby's problem list   Patient Active Problem List   Diagnosis   . Single liveborn, born in hospital, delivered by cesarean delivery, [redacted]w[redacted]d, 2865g       Maternal Exam:  Breast Exam: Soft  Breast Shape: Pendulous  Nipple Exam: Areola pliable, Short  Milk Stage Colostrum: Easily expressed  Medications of Concern: No  Infant Exam:  Infant State: Alert and active  Tone: Appropriate for gestational age  Oral Anatomy: Normal, Flanged   Jaw Movement: Gliding    Breast feeding observation  Session: Nutritive  Position: Football  Rooting: Opens mouth spontaneously  Latch: Latches with  shield   Latch with Shield: 24 mm - Medium   Suck Assessment: Appropriate suck pattern  Shape of Nipple: Round  Milk Transfer: Audible swallows noted   Assessment: Beginning to breast feed, Progressing well, mother/baby needs some support   Quality: Good    Pumping  Pump needed for discharge: No  Goal  Identifies nutritive suck/swallow assessment, Identifies signs of milk transfer, Infant increases swallows with breastfeeding    Teaching/Education  Teaching/Education: Proper alignment to facilitate good latch, Signs of Milk transfer, Taught breast massage and/or compression technique, Taught deeper latch technique    Handouts  Handouts reviewed/given: Breastfeeding Log, [employer], [employer], Support group information    Plan  Breastfeed at least 8 or more times in 24 hours (on demand)    Follow-up  Next day    Additional note: baby "Lauren Booth" has been actively wanting to feed. Difficulty latching without shield due to flat/short nipples. Pt has large breasts with easily expressible colostrum. Baby having transitional stools. Lc reviewed proper application of nipple shield and breast compressions to increase milk transfer. Encouraged a lot of skin to skin time. Pt will likely attend support group in  [place]. Lc referred to new beginnings booklet. Lc to f/u in am.

## 2015-11-08 ENCOUNTER — Encounter (HOSPITAL_COMMUNITY): Payer: Self-pay

## 2015-11-08 MED ORDER — DOCUSATE SODIUM 250 MG OR CAPS
250.0000 mg | ORAL_CAPSULE | Freq: Two times a day (BID) | ORAL | Status: DC | PRN
Start: 2015-11-08 — End: 2015-11-10
  Administered 2015-11-09: 250 mg via ORAL
  Filled 2015-11-08 (×2): qty 1

## 2015-11-08 MED ORDER — SENNA 8.6 MG OR TABS
2.0000 | ORAL_TABLET | Freq: Every day | ORAL | Status: DC | PRN
Start: 2015-11-08 — End: 2015-11-10
  Filled 2015-11-08: qty 2

## 2015-11-08 MED ORDER — BISMUTH SUBSALICYLATE 262 MG/15ML OR SUSP
30.0000 mL | Freq: Four times a day (QID) | ORAL | Status: DC | PRN
Start: 2015-11-08 — End: 2015-11-10
  Administered 2015-11-08: 30 mL via ORAL
  Filled 2015-11-08 (×2): qty 30

## 2015-11-08 NOTE — Progress Notes (Signed)
CNM POD#2 Note    S.  Pt states BF going well.  Saw LC yesterday and is using the nipple shield now on her L breast and states it is really helping.  Pt's mother has been staying w/her s/p C/S on 11/06/15.  States she is voiding and has some incisional pain but is taking pain medications when needed (Roxi/Tyloenol).  O.  BP 104/64 (BP Patient Position: Semi-Fowlers)  Pulse 80  Temp 98.6 F (37 C)  Resp 18  Ht 5\' 6"  (1.676 m)  Wt 71.2 kg (157 lb)  LMP 01/21/2015 (Exact Date)  SpO2 99%  Breastfeeding? Yes, going well  BMI 25.34 kg/m2  See MD POC note.  A.  S/P C/S for NR FHR tracing at 5.5 cms.  Thick mec.  FOB not involved.  P.  1)  LCSW Consult today-pt aware they will see her today b/c FOB NI.  Pt does have good support.  2)  Probable d/c tomorrow to home w/infant.  3)  Con't w/BF-pt aware LC may see her again today.  4)  Reviewed CNM on call # for emergencies.  5)  BCM-no partner right now.  Pt to use condoms PRN.  6)  [facility]  7)  See MD Note for PE/POC.    Achille Richebecca C. Garrett-Brown, CNM [phone]#[id number]

## 2015-11-08 NOTE — Progress Notes (Addendum)
POSTPARTUM NOTE  Lauren Booth is a 30 year old G2P1011 currently POD2 s/p pLTCS at 67w2dfor NRFHT following IOL for fetal decelerations at term.     Pregnancy Significant For:   - [place]    Method of Delivery: pLTCS  Estimated Blood Loss: 9364m   Date of birth: 11/06/2014 Time of birth: 4:22 AM Gestational Age:43 2/7     SUBJECTIVE  Patient reports feeling pain but with better pain control.     Ambulation: yes,   Void Spontaneous: yes  Pain Score: 7  Lochia: minimal  Breastfeeding: yes  Diet: Diet Regular, no nausea no vomiting.   Flatus: yes  Other: Denies shortness of breath, dizziness, headache, vision changes, right upper quadrant pain.     OBJECTIVE    Temperature:  [98.2 F (36.8 C)-98.6 F (37 C)] 98.6 F (37 C) (10/02 0208)  Blood pressure (BP): (89-104)/(47-69) 104/64 (10/02 0208)  Heart Rate:  [80-94] 80 (10/02 0208)  Respirations:  [18] 18 (10/02 0208)  Pain Score: 7 (10/02 0520)  1 episode of hypotension yesterday at 2000, patient asymptomatic and with low baseline.   afebrile and non tachycardic.     General: NAD  Heart: normal rate, regular rhythm; no murmurs, rubs, or gallops  Lungs: clear to ascultation; no increased work of breathing  Abdomen: Positive bowel sounds, appropriately tender  Fundus: firm fundus at 2cm below umbilicus  Incision: clean/dry/intact without erythema or drainage, steristrips in place  Perineum: no swelling  Extremities: trace edema  Neuro: 1+ reflexes, no clonus     Medications:  PRNs in past 12hrs  Tylenol x 2  Oxy 1074m3    UOP: 700m48mhrs = 233 mL/hr 10/2 1800-2100  No longer being recorded, previously adequate. Patient reports voiding regularly.       LABS  Recent Labs      11/05/15   1347  11/06/15   2050  11/07/15   1000   WBC  12.9*  20.2*  21.4*   HGB  13.5  10.2*  10.8*   HCT  39.2  29.4*  31.2*   PLT  174  147  174   MCV  87.1  89.4  88.4   MCHC  34.4  34.7  34.6   RDW  13.6  13.7  13.8   MPV  12.5*  11.8  11.5     EBL  930 mL    Antepartum EDS: 18  Postpartum EDS: 9    ASSESSMENT/PLAN  Lauren Booth is a 30 y61r old G2P1011 currently POD2 s/p  pLTCS at 41w269w2dNRFHT following IOL for fetal decelerations at term.     #. Post-operative day 2: meeting all milestones. Tolerating PO, ambulating, passing flatus  -- Encourage ambulation  -- EBL 930mL,1m 13.5->10,2->10.8, stable with no indication for repeat labs at this time.     #. Antepartum EDS: 18  - PP EDS 9, both negative for thoughts of self harm  - SW consult pending due to concern with FOB    #. Breast Feeding: yes  #. Birth Control Method: deferred discussion.   #. Prenatal labs: O POS, HIV neg, RPR neg, varicella immune, HepBsAg NR, GC/CT neg, quant neg, rubella nonimmune  #. Health Care Maintenance: offer influenza and MMR, Tdap 09/22/15  #. Disposition: Continue inpatient care.  #. Follow up: 6 week    MarianHilton [id number] Medicine - PGY1  11/08/15 5:18 AM  Note reviewed, edits made.  [place], [place], [place]    Attending:  Patient is postoperative day 2 status post primary low transverse cesarean section for non-reassuring fetal status remarkable for elevated ante EDS and relationship issues. No acute issues. SW to see. Meeting milestones. Agree with plan as outlined.  Ezra Sites, MD

## 2015-11-08 NOTE — Plan of Care (Signed)
Problem: Anxiety  Goal: Alleviation of anxiety  Outcome: Progressing toward goal, anticipate improvement over: next 12-24 hours  Goal: Knowledge of positive coping patterns  Outcome: Progressing toward goal, anticipate improvement over: next 12-24 hours    Problem: Deep Venous Thrombosis - Risk of  Goal: Absence of deep venous thrombosis  For patients with risks consider intermittent compression device, early ambulation, prophylaxis with low molecular weight heparin or intravenous/subcutaneous heparin.   Outcome: Progressing toward goal, anticipate improvement over: next 12-24 hours    Problem: Fluid Volume Imbalance, Risk of  Goal: Balanced intake and output  Outcome: Progressing toward goal, anticipate improvement over: next 12-24 hours  Goal: Absence of postpartum hemorrhage  Medication management may include oxytocin (for prevention, also), Methergine (avoid if hypertensive), prostaglandin.   Outcome: Progressing toward goal, anticipate improvement over: next 12-24 hours    Problem: Urinary Retention  Goal: Able to void spontaneously  Outcome: Progressing toward goal, anticipate improvement over: next 12-24 hours  Goal: Absence of postvoid residual  Outcome: Progressing toward goal, anticipate improvement over: next 12-24 hours

## 2015-11-08 NOTE — Lactation Note (Signed)
This note was copied from a baby's chart.  Per mom, baby has been latching with audible swallowing, but she feeds well for a few minutes, then pauses due to tachypnea. This behavior was noted during this feeding sessions with respiratory rate of 80. Baby was also 37 mm/dl so there is concern that may have to go to the NICU if this pattern continue. Mom does have some expressible colostrum, but also at risk for delayed lactogenesis due to emergency c/s with 1400 EBL. Explained to mom my concerns and recommended that supplement via SNS be initiated along with pumping. Baby was able to take 13 ml of gentlease at the breast, mom was able to pump 7 ml for both breasts which where then finger fed to baby. Feeding plan reviewed and left at bedside. Lactation to follow up tomorrow to reassess mom's milk supply and a pre/post weight may be beneficial once mom's breasts are full.

## 2015-11-08 NOTE — Progress Notes (Signed)
OB ANESTHESIA POST EPIDURAL FOLLOWUP NOTE    Referring MD: OB Service    Reason for Encounter: Labor Epidural to c/s    Pain is under control on current regimen.    Pt denies N/V, itching, HA, motor or sensory deficit. Pt currently comfortable.     Pt mentions that she is taking 15 minutes to urinate. She denies any retention or difficulty with complete emptying. She has no numbness or motor deficits. Unlikely to be secondary to epidural given that epidural removed on 11/06/15. Extremely low suspicion for epidural hematoma given lack of any other symptoms.     Plan of care per primary team. Will continue to follow patient.

## 2015-11-08 NOTE — Lactation Note (Signed)
This note was copied from a baby's chart.  My Term Infant Feeding Plan    My name is: Lauren Booth       Supplement feeding: at the breast    Supplement using: expressed breastmilk and term formula    For the next 24 hours please give me 20-30 mL every 3 hours or sooner    After feeding, mom can pump for 15 minutes using the plus mode (second button). Any expressed milk can be used for supplementing my next feeding.     In addition to pumping, hand expression before, after or between feedings is very helpful in increasing mom's milk supply. For videos on hand expression please refer to:   bfmedneo.com   [email or web address].[email or web address]://med.[employer].edu/newborns/professional-education/breastfeeding.html    Completed by:  [name], RN  Date: 11/08/2015

## 2015-11-09 ENCOUNTER — Ambulatory Visit (HOSPITAL_BASED_OUTPATIENT_CLINIC_OR_DEPARTMENT_OTHER): Payer: Medicaid Other

## 2015-11-09 ENCOUNTER — Ambulatory Visit (HOSPITAL_BASED_OUTPATIENT_CLINIC_OR_DEPARTMENT_OTHER): Payer: Medicaid Other | Admitting: Advanced Practice Midwife

## 2015-11-09 DIAGNOSIS — O9089 Other complications of the puerperium, not elsewhere classified: Secondary | ICD-10-CM

## 2015-11-09 DIAGNOSIS — R600 Localized edema: Secondary | ICD-10-CM

## 2015-11-09 DIAGNOSIS — M79669 Pain in unspecified lower leg: Secondary | ICD-10-CM

## 2015-11-09 MED ORDER — SIMETHICONE 80 MG OR CHEW
80.00 mg | CHEWABLE_TABLET | Freq: Three times a day (TID) | ORAL | 0 refills | Status: AC | PRN
Start: 2015-11-09 — End: ?

## 2015-11-09 MED ORDER — ACETAMINOPHEN 325 MG PO TABS
650.0000 mg | ORAL_TABLET | Freq: Four times a day (QID) | ORAL | 0 refills | Status: DC
Start: 2015-11-09 — End: 2015-11-17

## 2015-11-09 MED ORDER — HYDROCODONE-ACETAMINOPHEN 5-325 MG OR TABS
1.0000 | ORAL_TABLET | Freq: Four times a day (QID) | ORAL | 0 refills | Status: DC | PRN
Start: 2015-11-09 — End: 2015-11-17

## 2015-11-09 MED ORDER — IBUPROFEN 600 MG OR TABS
600.0000 mg | ORAL_TABLET | Freq: Four times a day (QID) | ORAL | 0 refills | Status: DC
Start: 2015-11-09 — End: 2015-11-17

## 2015-11-09 MED ORDER — SENNA 8.6 MG OR TABS
17.20 mg | ORAL_TABLET | Freq: Every day | ORAL | 0 refills | Status: AC | PRN
Start: 2015-11-09 — End: ?

## 2015-11-09 MED FILL — SENNOSIDES TAB 8.6 MG (SENNA 187 MG): MG | 15 days supply | Qty: 30 | Fill #0

## 2015-11-09 MED FILL — SIMETHICONE CHEW TAB 80 MG: MG | 10 days supply | Qty: 30 | Fill #0

## 2015-11-09 MED FILL — HYDROCODONE/APAP TAB 5-325 MG: MG | 7 days supply | Qty: 30 | Fill #0

## 2015-11-09 MED FILL — IBUPROFEN TAB 600 MG: MG | 10 days supply | Qty: 30 | Fill #0

## 2015-11-09 MED FILL — ACETAMINOPHEN TAB 325 MG: MG | 4 days supply | Qty: 30 | Fill #0

## 2015-11-09 NOTE — Plan of Care (Signed)
Problem: Deep Venous Thrombosis - Risk of  Goal: Absence of deep venous thrombosis  For patients with risks consider intermittent compression device, early ambulation, prophylaxis with low molecular weight heparin or intravenous/subcutaneous heparin.   Outcome: Progressing toward goal, anticipate improvement over: next 12-24 hours

## 2015-11-09 NOTE — Plan of Care (Signed)
Problem: Anxiety  Goal: Alleviation of anxiety  Outcome: Goal Met  Goal: Knowledge of positive coping patterns  Outcome: Goal Met    Problem: Deep Venous Thrombosis - Risk of  Goal: Absence of deep venous thrombosis  For patients with risks consider intermittent compression device, early ambulation, prophylaxis with low molecular weight heparin or intravenous/subcutaneous heparin.   Outcome: Goal Met    Problem: Fluid Volume Imbalance, Risk of  Goal: Balanced intake and output  Outcome: Goal Met  Goal: Absence of postpartum hemorrhage  Medication management may include oxytocin (for prevention, also), Methergine (avoid if hypertensive), prostaglandin.   Outcome: Goal Met    Problem: Urinary Retention  Goal: Able to void spontaneously  Outcome: Goal Met  Goal: Absence of postvoid residual  Outcome: Goal Met

## 2015-11-09 NOTE — Interdisciplinary (Addendum)
SOCIAL WORK [facility] ASSESSMENT     11/09/15 0049   Referral Information   Referral Type Adjustment to Illness;Post-Partum Depression Screening   Social Assessment   Primary Decision Maker Self   Advance Directive None   Interpreter Used? Not Needed   Social Determinates of Health   Prior Living Situation Lives with family   Support System Parents   Income None;Family Contributions   Primary Care Access Assigned PCP   Transportation Relies on Family/Friends   Medication Compliance Adheres to medication   Mental Health Assessment   Mental Status - Emotional Content Sadness   Mental Status - Psychological Self-Blame   Behavioral Assessment Tearful   Adjustment to Illness   Patient's Adjustment Acceptance   Family's Adjustment Acceptance   Over the past two weeks, how often have you been bothered by any of the following problems?   Little interest or pleasure in doing things 1   Feeling down, depressed, or hopeless 1   Depression Scale Subtotal - If Greater than or Equal to 3, complete the following two questions 2   Substance Abuse History (CAGE-AID)   Have you ever felt you ought to cut down on your drinking or drug use? 0   Have people annoyed you by criticizing your drinking or drug use? 0   Have you ever felt bad or guilty about your drinking or drug use? 0   Have you ever had a drink or used drugs first thing in the morning to steady your nerves or to get rid of a hangover? 0   Number of "Yes" Responses 0   Discharge Plans/Interventions   Anticipated Discharge Destination Home     SOCIAL WORKER [facility] ASSESSMENT  REASON FOR REFERRAL: ?Patient was admitted to NICU for need for observation, evaluation and treatment   ?   MOTHER OF BABY: NAME NUMBER   MOB is A 30 yo, G1P1 gave birth through C-section on 11/05/15 to baby girl 43 w 2d EGA. MOB is open to SW visits with her mother and father at her bedside mother sleeping and her father reading a news paper she was open to share her story while they were there. She was married  to her husband and living with him in [place] but he cheated on her so she left to live with her parents here in [place]. Her parents are very supportive and she plans to return there at discharge. MOB She is currently unemployed she had just been hired by [employer] but by the time their hiring process was over she was 8 months pregnant so she did not take the job. Right now she does not know if her husband will be helping her financially but she plans to apply for [employer].       FATHER OF BABY: ?  [name] he is 7 years old and he lives in [place]. [place] is currently separated from him because he cheated on her recently and she is devastated. He is aware that the baby is born and she thinks he will be involved with baby later.     HIGHEST LEVEL OF EDUCATION:   Some general education no college reported   HISTORY OF DRUGS: No reported history for MOM or FOB.       HISTORY OF MENTAL ILLNESS:   MOB reports no mental illness but she is dealing with situational stressors that are causing her pain and anxiety. She tearfully explains that she feels sad, anxious because she found out  that her husband was cheating on her she was devastated and she is still in shock. She wants him to be involved and she is not sure what will happen with their marriage right now she is just trying to process everything. She has told him that she has had the baby but he says he cant come because his [name] is expired. She says that she has a lot of things to figure out regarding their future and right now she does not know where they stand of if they will be able to work this out.      FINANCIAL SUPPORT  She does have baby supplies and her parents are helping her financially and are helping her with the baby supplies and baby's needs. Her family will help her with transportation to and from appointments. They have chosen a pediatrician Dr. Evette GeorgesMiguel Cortero.     IMPRESSION:  Pt appears to be coping well. She has great family support  and appears to be making choices that allow her to keep herself and her baby emotionally safe. She has realistic views and is able to articulate her needs. She is very bonded with the baby and is coming to terms with her relationship with the FOB. She understands PPD and what resources are available for should she need them.     PLAN/RESOURCES:   Provided [facility] brochure and information.   MOB made aware of WIC and [employer].   MOB will f/u with OB-GYN   SW remains available     Lauren LopesOTISHIA Lauren Linquist, LCSW  CELL PHONE: [phone]

## 2015-11-09 NOTE — Progress Notes (Signed)
Negative LE dopplers ordered for tenderness in left calf to rule out DVT. VSS. SpO2 99% on RA. Low suspicion for DVT, likely edema related to pregnancy    Dagmar HaitHannah Leza Apsey MD

## 2015-11-09 NOTE — Plan of Care (Signed)
Problem: Anxiety  Goal: Knowledge of positive coping patterns  Outcome: Progressing toward goal, anticipate improvement over: next 12-24 hours

## 2015-11-09 NOTE — Plan of Care (Signed)
Problem: Urinary Retention  Goal: Able to void spontaneously  Outcome: Progressing toward goal, anticipate improvement over: next 12-24 hours

## 2015-11-09 NOTE — Plan of Care (Signed)
Problem: Anxiety  Goal: Alleviation of anxiety  Outcome: Progressing toward goal, anticipate improvement over: next 12-24 hours

## 2015-11-09 NOTE — Progress Notes (Signed)
OB ANESTHESIA POST EPIDURAL FOLLOWUP NOTE    Referring MD: OB Service    Reason for Encounter: Labor Epidural to c/s    Pain is under control on current regimen.    Pt denies N/V, itching, HA, motor or sensory deficit. Pt currently comfortable.     Patient's difficulty with urination has resolved. Continues to have no other issues with HA, motor or sensory deficit.     Plan of care per primary team. Will continue to follow patient.

## 2015-11-09 NOTE — Progress Notes (Addendum)
POSTPARTUM NOTE  Lauren Booth is a 30 year old G2P1011 currently POD3 s/p pLTCS at 35w2dfor NRFHT following IOL for fetal decelerations at term.     Pregnancy Significant For:   - [place]    Method of Delivery: pLTCS  Estimated Blood Loss: 9339m   Date of birth: 11/06/2014 Time of birth: 4:22 AM Gestational Age:75 2/7     SUBJECTIVE  Patient reports feeling better but still with pain at her incision site and not feeling ready for discharge. She also reports that swelling in her left foot is worse than in the right.     Ambulation: yes,   Void Spontaneous: yes  Pain Score: 7  Lochia: minimal  Breastfeeding: yes  Diet: Diet Regular, no nausea no vomiting.   Flatus: yes  Other: Denies shortness of breath, dizziness, headache, vision changes, right upper quadrant pain.     OBJECTIVE    Temperature:  [98 F (36.7 C)-98.5 F (36.9 C)] 98 F (36.7 C) (10/03 0430)  Blood pressure (BP): (95-109)/(56-76) 96/63 (10/03 0430)  Heart Rate:  [80-93] 80 (10/03 0430)  Respirations:  [16-17] 16 (10/03 0430)  Pain Score: 7 (10/03 061478 O2 Device: None (Room air) (10/03 0608)  SpO2:  [99 %] 99 % (10/03 0608)  Normotensive, afebrile, non tachycardic.    General: NAD  Heart: normal rate, regular rhythm; no murmurs, rubs, or gallops  Lungs: clear to ascultation; no increased work of breathing  Abdomen: Positive bowel sounds, appropriately tender  Fundus: firm fundus at 2cm below umbilicus  Incision: clean/dry/intact without erythema or drainage, steristrips in place  Perineum: no swelling  Extremities: 1+ edema, worse in left than in right. Patient reports tenderness to palpation on left side. No erythema or warmth noted on the left extremity.   Neuro: 1+ reflexes, no clonus     Medications:  PRNs in past 12hrs  Tylenol x 2  Oxy 1018m2    UOP: No longer being recorded, previously adequate. Patient reports voiding regularly every 2 hours.       LABS  Recent Labs      11/06/15   2050  11/07/15   1000   WBC  20.2*  21.4*   HGB  10.2*  10.8*   HCT  29.4*  31.2*   PLT  147  174   MCV  89.4  88.4   MCHC  34.7  34.6   RDW  13.7  13.8   MPV  11.8  11.5     EBL 930 mL    Antepartum EDS: 18  Postpartum EDS: 9    ASSESSMENT/PLAN  Lauren Booth is a 30 74ar old G2P1011 currently POD3 s/p  pLTCS at 41w22w2d NRFHT following IOL for fetal decelerations at term.     #. Post-operative day 3: meeting all milestones. Tolerating PO, ambulating, passing flatus  -- Encourage ambulation  -- EBL 930mL62mb 13.5->10,2->10.8, stable with no indication for repeat labs at this time.    #. LE edema Left>right   -- Patients vitals stable, sating well on room air, non tachycardic. She denies chest pain or shortness of breath  -- Low suspicion for DVT however, patient in the post partum period with tenderness in her left calf will order LE dopplers to rule out DVT.      #. Antepartum EDS: 18  - PP EDS 9, both negative for thoughts of self harm  - SW saw, note pending.     #.  Breast Feeding: yes  #. Birth Control Method: would like copper IUD at 6wk pp visit   #. Prenatal labs: O POS, HIV neg, RPR neg, varicella immune, HepBsAg NR, GC/CT neg, quant neg, rubella nonimmune  #. Health Care Maintenance: offer influenza and MMR, Tdap 09/22/15  #. Disposition: Continue inpatient care.  #. Follow up: 6 [place] [id number]  Reproductive Medicine - PGY1  11/09/15 5:30 AM    Note reviewed, edits made.  [place], [place], [place]      Attending:  Patient is postoperative day 3 status post primary low transverse cesarean section for non-reassuring fetal status  remarkable for new calf pain and swelling this AM. Dopplers ordered. Vitals normal. No other acute issues. Meeting milestones. Agree with plan as outlined.  Ezra Sites, MD

## 2015-11-09 NOTE — Interdisciplinary (Signed)
MD at bedside. Pt c/o left calf pain when touching or moving foor. O2 sat @ 99% RA. Orders received.

## 2015-11-09 NOTE — Lactation Note (Addendum)
This note was copied from a baby's chart.  Lactation Note    Reason for Consult: Breastfeeding session, Primigravida, Other (supplementation), Tachypnea    Breastfeeding experience: No    Maternal Risk Factors: Risk for delayed lactogenesis   Unplanned or emergency C-Section  Infant Risk Factors: Term    Preferred language: English     Mother's Name:  Lauren Booth, Lauren Booth  29 year old    Mother's MRN: [id number]   Obstetric History    G2   P1   T1   P0   A1   L1     SAB1   TAB0   Ectopic0   Multiple0   Live Births1    Obstetric Comments   2017 Delivery.  Pt sent over from [facility]T for a deceleration.  Thick mec was noted w/AROM.  Terb x 2 in labor for NR FHR tracing at 5.5/C/0 (ROP).  Primary C/S done.  Nuchal/Body cord.  Baby Girl Alatiel.     The infant was born via C-Section, Low Transverse  Mom's problem list:   Patient Active Problem List   Diagnosis   . Encounter for supervision of normal first pregnancy in second trimester   . Stress due to marital problems   . Hx of breast implants, bilateral   . Chlamydia   . Rubella non-immune status, antepartum   . Hemorrhoids during pregnancy   . Screening for cervical cancer   . Maternal care for fetal decelerations during pregnancy   . Labor and delivery indication for care or intervention   . Status post primary low transverse cesarean section     Baby Information:  Hours of life: 4 days  GA: 41 2/7 weeks  Birth Weight: 101.06  Birth weight change: -6%   Baby's problem list   Patient Active Problem List   Diagnosis   . Single liveborn, born in hospital, delivered by cesarean delivery, [redacted]w[redacted]d, 2865g   . Tachypnea on examination     Maternal Exam:  Breast Exam: Engorged  Breast Shape: Pendulous  Nipple Exam: Areola non-pliable, Slight tenderness  Milk Stage: Colostrum  Milk Stage Colostrum: Easily expressed  Medications of Concern: No    Infant Exam:  Infant State: Alert and active, Fussy  Tone: Appropriate for gestational age  Oral Anatomy: Normal, Flanged   Jaw Movement:  Gliding    Breast feeding observation  Session: Nutritive  Position: Football  Rooting: Opens with stimulation  Latch: Latches with shield, Good latch with assistance   Latch with Shield: 24 mm - Medium   Suck Assessment: Transitional suck pattern (4-10 sucks in a row), Rhythmic motion (on and off sucking-fussy at breast)  Shape of Nipple: Used shield  Milk Transfer: Audible swallows noted, Baby needs supplementation, Test wt, Visible swallowing noted, Good milk transfer, still needs supplement offered   Test Weight (mL/gm): 20  Assessment: Progressing well, mother/baby needs some support, Positioning and latch with use of SNS   Quality: Good    Pumping  Pump needed for discharge: Yes (mom needs to be asked if she has pump for home)  Assessment: Adequate frequency of pumping, Correctly using equipment, Milk volume adequate for infant needs, Milk volume increasing  Type: Symphony  Flange Size: 24 mm, 27 mm (was using 27 but wants to use 24, reviewed using lanolin on nipples with pumping)  Milk Volume each pumping session (mL): 26    Goal  Identifies nutritive suck/swallow assessment, Identifies signs of milk transfer, Improve maternal confidence with breastfeeding, Improve maternal confidence with pumping,  Increase milk volume, Infant increases swallows with breastfeeding, Infant transfers full volume at breast    Teaching/Education  Teaching/Education: Proper alignment to facilitate good latch, Signs of Milk transfer, Taught breast massage and/or compression technique, Baby feeding volumes vs mom's pumping volume, Breast compression during pumping, Breast/nipple care, Correct use of nipple shield, Daily Feeding plan, Hands on pumping, Milk collection and storage, Pumping routine and frequency, Pumping techniques, Skin to skin, Taught supplement technique, Taught deeper latch technique, Taught positioning technique  Taught Supplement Technique: SNS  Pumping Techniques: Breast compressions  Milk Collection and  Storage: Appropriate storage containers for milk volume    Handouts  Handouts reviewed/given: None    Plan  Breastfeed at least 8 or more times in 24 hours (on demand), Pump after feeding, Skin to skin, Continue supplementation of PRN depending how baby breastfeeds.  Supplementation Method: SNS at the breast, switch to bottle as needed if supplementation needed for discharge  Supplementation Types: EBM    Follow-up  Next day    Additional note: Reviewed with mom importance of being firm with baby when positioning baby with chin compressing breast and importance of breast compression with breastfeeding and pumping, especially since mom is now engorged. Reviewed use of nipple tx and breast tx with heat and ice and massage, though mom states it hurts to massage and compress, reviewed importance of softening breasts. Baby fussy at breast, probably due to tachypnea and if baby was consistent with sucks and swallows at breast, then baby would be able to transfer full volume, but at this time baby needs SNS at supplementation until per pre and post weight baby transferring more at breast.

## 2015-11-09 NOTE — Plan of Care (Signed)
Problem: Anxiety  Goal: Alleviation of anxiety  Outcome: Progressing toward goal, anticipate improvement over: Next 24-48 hours  Patient is resting in bed, is calm and cooperative. Relatives are at the bedside for additional support.   Intervention: Anxiety risk assessment  Patient is watching TV, while breastfeeding.   Intervention: Anxiety signs and symptoms assessment  No signs of anxiety noted.   Intervention: Parent-infant bonding assessment  Patient bonding well, skin- to-skin with the baby.   Intervention: Sleep deprivation signs and symptoms assessment  Patient states less sleep, but is coping well.     Goal: Knowledge of positive coping patterns  Outcome: Progressing toward goal, anticipate improvement over: Next 24-48 hours  Patient is coping well. Encouraged patient to ask questions related to her care.     Problem: Deep Venous Thrombosis - Risk of  Goal: Absence of deep venous thrombosis  For patients with risks consider intermittent compression device, early ambulation, prophylaxis with low molecular weight heparin or intravenous/subcutaneous heparin.   Outcome: Progressing toward goal, anticipate improvement over: Next 24-48 hours  Intervention: Deep venous thrombosis risk assessment  Patient is ambulating, no compression stockings placed at this time.   Intervention: Deep venous thrombosis signs and symptoms assessment  Slight bilateral peripheral edema present in lower extremities but negative Homan's sign.       Problem: Fluid Volume Imbalance, Risk of  Goal: Balanced intake and output  Outcome: Progressing toward goal, anticipate improvement over: Next 24-48 hours  Intervention: Fluid imbalance assessment  Vital signs are stable, within normal limits.   Intervention: Maintain intravenous fluid infusion and initiate oral fluids when appropriate  No IV access.     Goal: Absence of postpartum hemorrhage  Medication management may include oxytocin (for prevention, also), Methergine (avoid if  hypertensive), prostaglandin.   No active bleeding noted, bleeding within normal limits.   Intervention: Uterine involution assessment  Fundus is firm and palpable.       Problem: Urinary Retention  Goal: Able to void spontaneously  Outcome: Goal Met

## 2015-11-09 NOTE — Lactation Note (Addendum)
This note was copied from a baby's chart.  My Term Infant Feeding Plan    My name is: Lauren Booth      Watch for my hunger cues, don't wait for me to cry.   Please keep me skin to skin when you are awake and feeling well.  This helps your milk to come in faster.   Breastfeed me at least 8-12 times in 24 hours using feeding cues.   No pacifiers please!    Supplement feeding: at the breast    Supplement using: expressed breastmilk    For the next 24 hours please give me 20 mL every 3 hours or sooner    After feeding, mom can pump for 15 minutes using the expression mode. Any expressed milk can be used for supplementing my next feeding.     In addition to pumping, hand expression before, after or between feedings is very helpful in increasing mom's milk supply. For videos on hand expression please refer to:   bfmedneo.com   http://cervantes.com/http://med.[employer].edu/newborns/professional-education/breastfeeding.html    Completed by:  Deliah Goodyuth M [name] RN, BSN, IBCLC  Date: 11/09/2015

## 2015-11-10 MED ORDER — INFLUENZA VAC SPLIT QUAD 0.5 ML IM SUSY
0.50 mL | PREFILLED_SYRINGE | INTRAMUSCULAR | Status: AC
Start: 2015-11-10 — End: 2015-11-10
  Administered 2015-11-10: 0.5 mL via INTRAMUSCULAR
  Filled 2015-11-10: qty 0.5

## 2015-11-10 MED ORDER — INFLUENZA VAC SPLIT QUAD 0.5 ML IM SUSY
0.5000 mL | PREFILLED_SYRINGE | INTRAMUSCULAR | Status: DC
Start: 2015-11-10 — End: 2015-11-10

## 2015-11-10 NOTE — Progress Notes (Addendum)
POSTPARTUM NOTE  Lauren Booth is a 30 year old G2P1011 currently POD4 s/p pLTCS at 82w2dfor NRFHT following IOL for fetal decelerations at term.     Method of Delivery: pLTCS  Estimated Blood Loss: 9343m   Date of birth: 11/06/2014 Time of birth: 4:22 AM Gestational Age:29 2/7     SUBJECTIVE  Patient reports feeling much better with improvement in her lower leg swelling. She feels ready for discharge.     Ambulation: yes,   Void Spontaneous: yes  Pain Score: 4  Lochia: minimal  Breastfeeding: yes  Diet: Diet Regular, no nausea no vomiting.   Flatus: yes  Other: Denies shortness of breath, dizziness, headache, vision changes, right upper quadrant pain.     OBJECTIVE    Temperature:  [97.7 F (36.5 C)-98.6 F (37 C)] 98 F (36.7 C) (10/04 0201)  Blood pressure (BP): (101-110)/(61-74) 103/74 (10/04 0201)  Heart Rate:  [77-89] 77 (10/04 0201)  Respirations:  [15-18] 16 (10/04 0201)  Pain Score: 4 (10/04 0331)  O2 Device: None (Room air) (10/03 1545)  SpO2:  [97 %-99 %] 97 % (10/03 1200)  Normotensive, afebrile, non tachycardic.    General: NAD  Heart: normal rate, regular rhythm; no murmurs, rubs, or gallops  Lungs: clear to ascultation; no increased work of breathing  Abdomen: Positive bowel sounds, appropriately tender  Fundus: firm fundus at 2cm below umbilicus  Incision: clean/dry/intact without erythema or drainage, steristrips in place  Perineum: no swelling  Extremities: 1+ edema in bilateral feet. Patient denies tenderness to palpation on calves. No erythema or warmth noted on either extremity  Neuro: 1+ reflexes, no clonus     Medications:  PRNs in past 12hrs  Tylenol x 2  Oxy 103m2    UOP: No longer being recorded, previously adequate. Patient reports voiding regularly every 2 hours.       LABS  Recent Labs      11/07/15   1000   WBC  21.4*   HGB  10.8*   HCT  31.2*   PLT  174   MCV  88.4   MCHC  34.6   RDW  13.8   MPV  11.5     EBL 930 mL    Antepartum EDS: 18  Postpartum EDS:  9    ASSESSMENT/PLAN  Lauren Booth is a 30 37ar old G2P1011 currently POD4 s/p  pLTCS at 41w45w2d NRFHT following IOL for fetal decelerations at term.     #. Post-operative day 4: meeting all milestones. Tolerating PO, ambulating, passing flatus  -- Encourage ambulation  -- EBL 930mL48mb 13.5->10,2->10.8, stable with no indication for repeat labs at this time.    #. LE edema Left>right: Now resolved.   -- Patients vitals stable, sating well on room air, non tachycardic. She denies chest pain or shortness of breath  -- LE dopplers negative and vitals continue to be stable.    #. Antepartum EDS: 18  - PP EDS 9, both negative for thoughts of self harm  - SW reports that patient appears to be coping well and has great family support. The patient was provided with resources for post partum depression should she need them as well as information regarding WIC and [employer].     #. Breast Feeding: yes  #. Birth Control Method: would like copper IUD at 6wk pp visit   #. Prenatal labs: O POS, HIV neg, RPR neg, varicella immune, HepBsAg NR, GC/CT neg, quant neg, rubella nonimmune  #.  Health Care Maintenance: offer influenza and MMR, Tdap 09/22/15  #. Disposition: Discharge home today   #. Follow up: 6 [place] [id number]  Reproductive Medicine - PGY1  11/10/15 5:17 AM    Note reviewed, edits made.   [place], [place], [place]    Attending:  Patient is postoperative day 4 status post primary low transverse cesarean section for non-reassuring fetal status remarkable for elevated EDS. No acute issues. SW following. Meeting milestones. Agree with plan as outlined.  Ezra Sites, MD

## 2015-11-10 NOTE — Lactation Note (Signed)
This note was copied from a baby's chart.  Mom's breasts are very full, so discussed management of engorgement. Mom has not pumped since yesterday am and if more comfortable than the previous day. Baby fed for about 15 minutes on the right side, mom still is using the shield. Audible suck/swallows heard, and baby had several coughing episodes due large amount of milk flow. Large amounts of milk seen in the shield after feeds, baby's weight increased by 3% in the past 24 hours breastfeeding only. Discussed support group resources, [facility]. She will return to school at some point in the future, but at this time is planning mainly to be home with baby. Will likely d/c today if baby is cleared to go by pediatric team.

## 2015-11-10 NOTE — Discharge Instructions (Signed)
Diet Instructions (Instrucciones de dieta):    Diet at home / Dieta en casa: ***Regular    Activity instructions (instrucciones de actividades):  -- DO NOT engage in sexual activity for 4 - 6 weeks.      NO tenga relaciones sexuales de 4 - 6 semanas.  -- DO NOT place anything in the vagina for 4 - 6 weeks.      NO ponga ninguna cosa en la vagina de 4 - 6 semanas.  -- DO NOT lift more than the baby.      NO levante nada mas pesado que el bebe.  -- DO NOT take tub baths.  It is OK to shower.      No tome un bano.  Es aceptable tomar [employer].    Your medication list is located on this After Visit Summary in the Medication section.  Una lista de sus medicamentos esta incuido en estas instrucciones.    Signs and Symptoms to Report (Sintomas y senales que debe reportar)    Call your hospital physician if you have / Llame a su medico si tiene:  -- Increased pain / aumenta el dolor  -- Heavy vaginal bleeding or clots / sandrago vaginal severo o coagulos  -- Temperature above 100 F / temperatura mas que 100 F  -- Foul smelling discharge / desecho con Pharmacologist  -- Observe for signs of mastitis (breast infection), including redness, warmth, or severe tenderness in one or both breasts associated with fever / observe por indicaciones de mastitis (infeccion de seno), incluyendo rojo, calor, o dolor intenso en uno o los dos senos asociado con [place].  -- Other / otros:  Any other concerns/ cualquier otra preocupacin      Phone number to call / Numero de telephono para llamar:  [phone]    ***If You Had a C-Section  (Si tuvo operacion cesaria)    -- When taking a shower, let warm water flow freely on the incision.      Cuando tome una ducha, deje que el agua fluir libremente sobre la incision.  -- Do not use harsh or perfumed soaps.      No use jabon duro o perfumado.  -- Monitor for signs of infection around the wound, including redness, warmth, or      increasing tenderness around the wound.      Observe por indicaciones de  infeccion sobre la herida, incluyendo rojo, Freight forwarder,      o mas dolor sobre la herida.  -- Do not drive while taking narcotic pain medications.      No maneje cuando tomando medicamentos fuertes para Conservation officer, historic buildings.  -- If your staples were removed in the hospital, the Steri-Strips need to stay on for 7 to 10 days.  After 10 days, please remove them.  If staples were left in place, please follow-up as instructed in clinic for staple removal.  Si las grapas fueron sacados antes de que salir del hospital, los Steri-Strips necesitan quedar por siete a diez dias.  Despues de [employer], por favor quitelas.  Si las grapas [employer], por favor siga las instrucciones para su cita en la clinica para quitarlas.    Medical Equipment Needed (Equipo medico necesario)    Equipment (equipo):  None.    Contact information (informacion de contacto)  Not applicable    Special Instructions, Comments, and Handouts (Instrucciones especiales, comentarios, y folletos)    -- Review "Caring for Your Baby and Yourself" booklet.  Repase el folleto "Como cuidarse a usted misma y atender a su bebe"  -- For questions about breastfeeding, call [phone].      Llame si tiene preguntas Ford Motor Companysobre como amamantar [phone].  -- Other instructions / otras instrucciones:  none***      Follow Up Appointment (Cita de seguimineto)  Appointment / cita:                                         Postpartum visit***  When / cuando:    6 weeks postpartum / 6 semanas postparto  Date and Time / fecha y hora:            Call to schedule / [place] para arreglar   Provider / proveedor:   [facility]***  Clinic or Location / clinica o localidad: [facility]*** [facility]***      Call your clinic TODAY to arrange a follow up appointment for 1 week and 6 weeks postpartum / Llame la clinic HOY para concertar una cita de seguimiento por 1 semana y 6 semanas despus del Public librarianparto  [facility] [phone]   [facility]  [phone]  [address], [address] [phone]  [facility] [phone]   [facility] [phone]   [place] [phone]    It is very important for you to keep a current medication list with you in order to assist your doctors with your medical care.  Bring this After Visit Summary with you to your follow up appointments.    Additional Discharge Information (if applicable)    It is very important for you to keep a current medication list with you in order to assist your doctors with your medical care.  Bring this After Visit Summary with you to your follow up appointments. ***Es muy importante que usted mantenga una lista actual de medicamentos con usted con el fin de ayudar a sus mdicos con su atencin mdica. Traiga este Resumen Despus de Visita con usted a sus citas de seguimiento.***      Additional Discharge Information (if applicable)     If you wish to have a Nexplanon/IUD post-partum you should call the clinic to have a 4 week post-partum visit. We can then arrange Nexplanon/IUD placement for 2 wks later (6 weeks post-partum). ***Si usted desea tener un Implanon / DIU posparto debe llamar a la clnica para que una visita de 4 semanas despus del parto. Entonces podemos organizar la colocacin Implanon / DIU durante 2 semanas ms tarde (6 semanas despus del parto).***    Micronor  Start taking micronor birth control pills once breastfeeding is well established. Start taking it daily at the SAME TIME EVERY DAY 2-3 weeks after your delivery. Comience a tomar pldoras anticonceptivas Micronor duepues de la lactancia est bien establecida. Empiece a tomarlo CarMaxtodos los das a la misma hora cada da, American Standard Companiesempezando en 2-3 semanas despus del [place]parto.***    Future Appointments  Date Time Provider Department Center   12/21/2015 10:00 AM Timpe, Bing ReeBeth K, CNM [facility]

## 2015-11-11 NOTE — Progress Notes (Signed)
Patient not seen by CNM. Had fetal decel in ANT and [facility] prolonged [facility].  Lauren Booth, [place]  PID# [id number]

## 2015-11-16 ENCOUNTER — Ambulatory Visit (HOSPITAL_BASED_OUTPATIENT_CLINIC_OR_DEPARTMENT_OTHER): Payer: Medicaid Other | Admitting: Advanced Practice Midwife

## 2015-11-17 ENCOUNTER — Other Ambulatory Visit (HOSPITAL_BASED_OUTPATIENT_CLINIC_OR_DEPARTMENT_OTHER): Payer: Self-pay | Admitting: Obstetrics & Gynecology

## 2015-11-17 NOTE — Telephone Encounter (Signed)
From: Rakesha Cortes-Espino  To: Arrie AranLacoursiere, Daphne Yvette, MD  Sent: 11/17/2015 10:42 AM PDT  Subject: Medication Renewal Request    Original authorizing provider: Arrie Aranaphne Yvette Lacoursiere, MD    Era SkeenAlejandra Cortes-Espino would like a refill of the following medications:  acetaminophen (TYLENOL) 325 MG tablet [Daphne Edrick KinsYvette Lacoursiere, MD]  ibuprofen (MOTRIN) 600 MG tablet [Daphne Edrick KinsYvette Lacoursiere, MD]  HYDROcodone-acetaminophen (NORCO) 5-325 MG tablet Bard Herbert[Daphne Edrick KinsYvette Lacoursiere, MD]    Preferred pharmacy: [facility] - [place], [place] - [address]    Comment:  good moorning, I am about to run out of pain medications and the c section still hurts a lot. Thank you

## 2015-11-23 ENCOUNTER — Telehealth (HOSPITAL_BASED_OUTPATIENT_CLINIC_OR_DEPARTMENT_OTHER): Payer: Self-pay

## 2015-11-23 NOTE — Telephone Encounter (Signed)
Pt c/o bleeding from caesarian  Incision. States she is not bleeding a lot. Denies fever and discharge.     Pt ph. [phone], vmail ok.

## 2015-11-23 NOTE — Telephone Encounter (Signed)
Pt following up on triage call. Please advise.

## 2015-11-23 NOTE — Telephone Encounter (Signed)
Returned call to patient.  Patient states she still has the steri strips but for the past 3 days she has had constant bloody drainage from her incision. She denies fever, increase in pain, or foul odor. Advised patient to come for nurses visit for wound check and cleaning tomorrow at 1030. Patient verbalizes understanding and thankful for call.

## 2015-11-24 ENCOUNTER — Ambulatory Visit: Payer: Medicaid Other | Attending: Obstetrics & Gynecology

## 2015-11-24 VITALS — BP 103/73 | HR 93 | Temp 98.5°F | Resp 18

## 2015-11-24 DIAGNOSIS — Z98891 History of uterine scar from previous surgery: Principal | ICD-10-CM | POA: Insufficient documentation

## 2015-11-24 MED ORDER — ACETAMINOPHEN 325 MG PO TABS
650.00 mg | ORAL_TABLET | Freq: Four times a day (QID) | ORAL | 0 refills | Status: AC
Start: 2015-11-24 — End: ?

## 2015-11-24 MED ORDER — HYDROCODONE-ACETAMINOPHEN 5-325 MG OR TABS
1.00 | ORAL_TABLET | Freq: Four times a day (QID) | ORAL | 0 refills | Status: AC | PRN
Start: 2015-11-24 — End: ?

## 2015-11-24 MED ORDER — IBUPROFEN 600 MG OR TABS
600.00 mg | ORAL_TABLET | Freq: Four times a day (QID) | ORAL | 0 refills | Status: AC
Start: 2015-11-24 — End: ?

## 2015-11-24 NOTE — Interdisciplinary (Signed)
30 y.o. postpartum patient, s/p c-section 11/06/15 for fetal distress, now presenting for wound check and care.     Review of Symptoms:    Patient on review of sx today states she is doing well.    Pain: 5/10, not taking pain meds because she ran out, requesting a refill.  Breastfeeding w/o problems.  Afebrile.    Wound Assessment:      Depth: none  Tunneling: none  Slough: none  Odor: none  Peri wound area: intact  Pt seen, evaluated, and discussed with Dr Hyacinth MeekerMiller.  Plan:     Wound cleaned with sterile normal saline, steri strips removed  Pt tolerated procedure well.  Wound care as above.  Plan discussed with patient  instructed in wound care procedure.  Questions answered and pt verbalized understanding of instructions.    Pt instructed to eat at least 4 protein snacks per day, red meat whenever possible.  Also instructed to drink > 8 - 10 glasses of water per day.  Pt encouraged to rest and not lift anything heavier than the baby.        ER precautions reviewed and pt agrees to go to ER for fever>100.4, increased pain, swelling, redness, or foul odor.

## 2015-12-21 ENCOUNTER — Ambulatory Visit (HOSPITAL_BASED_OUTPATIENT_CLINIC_OR_DEPARTMENT_OTHER): Payer: Medicaid Other | Admitting: Advanced Practice Midwife

## 2015-12-22 ENCOUNTER — Ambulatory Visit (HOSPITAL_BASED_OUTPATIENT_CLINIC_OR_DEPARTMENT_OTHER): Payer: Medicaid Other | Admitting: Advanced Practice Midwife

## 2015-12-24 ENCOUNTER — Ambulatory Visit (HOSPITAL_BASED_OUTPATIENT_CLINIC_OR_DEPARTMENT_OTHER): Payer: Medicaid Other | Admitting: Advanced Practice Midwife

## 2016-01-12 ENCOUNTER — Ambulatory Visit: Payer: Medicaid Other | Attending: Advanced Practice Midwife | Admitting: Advanced Practice Midwife

## 2016-01-12 DIAGNOSIS — Z124 Encounter for screening for malignant neoplasm of cervix: Secondary | ICD-10-CM | POA: Insufficient documentation

## 2016-01-12 DIAGNOSIS — Z23 Encounter for immunization: Secondary | ICD-10-CM | POA: Insufficient documentation

## 2016-01-12 NOTE — Progress Notes (Signed)
Post-Partum Visit    Lauren Booth is a 30 year old who presents today for a routine postpartum exam. She is here with her baby and her mother. [place] starts in January. Feels very well!    Date of delivery: 11/06/15 at 42 weeks' gestation.   Delivery mode was: c/s for abnormal FHT   Perineum at Delivery: perineum intact  Gender: Female Birth weight: 6 lbs.5 oz.  Infant Outcome: Normal newborn  Infant feeding: breast  Antepartum/Intrapartum issues: hemorrhage    Level of Activity: full  Mood: euthymic  [place]:                      EDS Score (Calculated): 2    Menses Resumed: yes  Coitus Resumed: no  Contraceptive Plans: Abstinence    I have reviewed Lauren Booth's history for contraceptive risk factors, and she has no contraindications to contraceptive method as planned.    OBJECTIVE:   BP 109/74 (BP Location: Right arm, BP Patient Position: Sitting, BP cuff size: Regular)  Pulse 83  Ht 5\' 6"  (1.676 m)  Wt 65.8 kg (145 lb 1 oz)  LMP 01/21/2015 (Exact Date)  BMI 23.41 kg/m2 Body mass index is 23.41 kg/(m^2).  Physical Exam:  General Appearance: well developed and well nourished  Lungs: clear to auscultation  Breast: normal and no lymphadenopathy, skin changes, masses or discharge  Heart: normal  Abdomen: Abdomen soft, non-tender. BS normal. No masses, organomegaly  Extremities, Peripheral Vascular: Normal  Pelvic Exam: normal  Vulva: Normal external genitalia and Bartholin's glands, urethra, Skene's glands negative  Vagina: Normal mucosa, scant blood.  Cervix: No lesions or cervical motion tenderness  Uterus: Normal shape, position and consistency  Adnexa: Not palpable  Anus/Perineum: normal appearing, no lesions  Rectal: deferred    ASSESSMENT: normal postpartum course    PLAN:  Education provided continue prenatal vitamins, diet and fluid intake, postpartum depression, s/s mastitis  pap smear done - will notify patient of results    Lauren Booth, CNM  [phone]

## 2016-01-12 NOTE — Patient Instructions (Signed)
Your Six-week Postpartum Check-up: A Health Care Guide for New Mothers     You have spent months preparing for your baby's birth. You've probably read every book, article, and website to make sure you're eating right, exercising at the appropriate level and taking the necessary vitamins and supplements. Your effort has paid off. CONGRATULATIONS!    After giving birth, it's important for you to keep up the healthy habits you practiced while you were pregnant.    DIET, NUTRITION, & EXERCISE    Weight Loss     Returning to pre-pregnancy weight is a common goal. Combining healthy diet with exercise will help you lose weight safely. Dieting after pregnancy can decrease bone mineral density, and it's important to get enough calcium and do weight- bearing activities.     GOAL: Lose weight gradually- 4.5 lbs/month maximum after first month post-delivery (unless you had a high pre-pregnancy weight):  Be patient.  Consume at least 1,800 calories per day (an additional 500 calories per day is recommended if you're breastfeeding).   Drink plenty of fluids (moderate caffeine intake, such as 1 cup of coffee per day, and occasional alcohol consumption are okay).    Nutrition    A well-balanced diet is essential for women before, during, and after pregnancy. Most multivitamins and prenatal vitamins don't supply enough calcium. Also, breastfeeding mothers transfer 250-350 mg of calcium to their baby through breast milk when they're nursing. Vitamin and mineral supplements can help ensure that you get the nutrients you need. Fiber and water will help keep stools soft and prevent or reduce hemorrhoids.    GOAL: 1,000 mg of calcium daily for adult women (1,300 mg for adolescents):  Eat foods such as low-fat and fat-free dairy products and leafy vegetables (e.g., broccoli, kale, and collards).   If food choices don't supply the recommended calcium, take a calcium supplement (e.g., Calcium Soft Chews, Caltrate, Oc-Cal, Tums, or  Viactiv with meals. Note: 400-800 IU of vitamin D helps your body absorb calcium).    GOAL: daily: 15 mg of iron daily  Eat foods such as fortified cereals, lean beef, dried fruits, tofu, oysters, and spinach.   If the time between pregnancies is short, talk to your health care provider to see if you should take an iron supplement as well.    Exercise    Regular physical activity after delivery should be a part of every new mother's daily life. A gradual return to exercise is recommended. Some women may be able to start exercising within days of delivery; others may need to wait four to six weeks. Talk to your health care provider about what exercise schedule and level are right for you.     GOAL: Strengthen the pelvic floor and abdominal muscles; reduce the risk of urinary stress incontinence (urine leakage):   Do Kegel exercises: Contract the pelvic muscles for 10 seconds and then relax them for 10 seconds. Do this for 15 minutes, four times/day.     GOAL: Keep bones strong; tone and shape your body: Do weight-bearing exercises, (e.g., walking or cycling), which help maintain strong, dense bones.   If you're nursing, breastfeed before exercising to minimize breast discomfort.    PHYSICAL, EMOTIONAL AND SEXUAL NEEDS    Physical Exam    Don't be embarrassed to discuss with your health care provider all aspects of your physical health including conditions that may result from delivery.     GOAL: Thorough post post-delivery health exam:  Talk to your health care   provider about:   - Breast health and breastfeeding   - Constipation   - Hemorrhoids   - Vaginal discharge   - Urinary incontinence (leakage)   - Vaginal healing   - Varicose veins   - Weight loss   - Exercise    Emotional Adjustment    Many women have emotional changes after delivery. Let your health care provider know if you've been feeling overwhelmed, anxious, sad, isolated, nervous, obsessive, incompetent, exhausted, or you can't sleep. Your health care  provider can help you feel and cope better.     GOAL: Good health and well well-being:  Take time for yourself.   Get enough rest.   Call on family and friends for help.   Consider joining a mothers' or postpartum support group   Delay going back to work for at least 6 weeks after delivery.     Ask your health care provider about:   - Mood swings and "baby blues"   - Symptoms of postpartum depression   - Ways to prevent depression   - Planning for hormonal shifts (e.g., when you're weaning your baby or your period starts again)    Postpartum Depression    Postpartum depression is a serious issue that we address with all of our moms at their follow up visit. Women who have had depression or anxiety in the past, or who were treated for anxiety and/or depression during their pregnancy are at a higher risk for presenting with postpartum depression. There are several emotional, hormonal, and physiological changes that are responsible for postpartum depression, so women with NO history of depression or anxiety are also at risk for developing it. The most important thing to know is that having postpartum depression does not make you a bad mother or reflect on your ability to care for your baby.    Women who score high on our Edinburgh Depression Scale will be offered resources, however we encourage you to call and talk to us or request a Maternal Mental Health consult at any time if you feel like you need some support. Sometimes, just organizing your thoughts out loud to someone other than your spouse or immediate family and friends makes a big difference. There are many resources in [place]. You can start with us, or at the following website: http://postpartumhealthalliance.org/    Sexuality and Contraception    Lack of interest in sex is common after childbirth and for the first couple of months afterwards. Most women experience a gradual return to pre-pregnancy levels of sexual desire, enjoyment, and frequency within  a year of giving birth, but every woman has her own timetable. The return to fertility is unpredictable. You may be able to get pregnant before your periods return, even when you're breastfeeding. For most women who aren't nursing, ovulation occurs about 45 days postpartum, but it can be earlier. Discuss family planning with your health care provider.     It might be best for you and your confidence in the bedroom to do some self- exploration before you try to have sex for the first time. Try inserting one or two fingers when you are next in the tub or the shower to see how it feels. TALK before you have your first attempt at sex. It is important to verbalize any concerns to your partner, as well as to set reasonable expectations for sex after giving birth. We recommend using lots of foreplay in the beginning, and it may be handy to have lubricant   near by so that things go. smoothly!    GOAL: Healthy sexuality:  Keep an open dialogue with your partner about your readiness to make love.   Make time for cuddling and kissing to re-establish physical closeness.   Ask your health care provider about:   - When to resume sexual intercourse   - How to minimize discomfort   - Effects of breastfeeding or hormones on sexual desire    GOAL: Post-delivery contraception:  Think about whether or not you'd like to have more children.   Before you resume sexual activity, ask your health care provider about:   - Contraceptive options during breastfeeding and afterward   - The benefits and risks of all suitable methods      BREASTFEEDING    Did you know that the recommended length of exclusive breastfeeding is for one year. Not all women are able to achieve this goal but whatever breast milk you are or were able to provide your baby is a gift. New moms need to consume more water and calories while they are breastfeeding than during pregnancy by about 2 cups of water and 250 calories. Breastfeeding may help to accelerate weight loss,  but should be done in conjunction with a healthy, progressive exercise plan.     [place] has incredible resources for breastfeeding. You can visit our website at [email or web address] for access to online resources, courses, and support groups.    The [place] community also has many breastfeeding resources, both online and in groups. Check out the following websites for more great information and resources for breastfeeding moms!    [place] [employer] [facility]  https://www.breastfeeding.org/    World Health Organization: Breastfeeding recommendations and employee resources   http://www.who.int/topics/breastfeeding/en/    CDC Breastfeeding publications and resources  http://www.cdc.gov/breastfeeding/resources/index.htm    [employer] [place]  http://www.lalecheleague.org/web/[place].html

## 2016-01-13 NOTE — Procedures (Signed)
SPECIMENS SUBMITTED:  Cervical (Pap) Smear;Thin-Prep Vial Received    CLINICAL INFORMATION:  LMP: 01/21/2015 PostPartum; Other Clinical Findings: History of Abnormal  Pap Smear, Dysplasia, Cervical Cancer Within 5 Years  FINAL CYTOLOGIC INTERPRETATION:  No Atypical or Malignant Cells    COMMENT:  HPV Subtyping will be performed on excess Thin-Prep collection fluid from  the specimen vial.  Result will be reported in EPIC separately.    SPECIMEN ADEQUACY:  Satisfactory for evaluation  The Pap smear is a screening test with an inherent low error rate. Although  a normal [negative] result is highly predictive of the absence of cervical  cancer and its precursor lesions, it does not exclude the presence of  significant disease. Results must be evaluated within the context of the  individual patient.  CONFIDENTIAL HEALTH INFORMATION: Health Care information is personal and  sensitive information. If it is being faxed to you it is done so under  appropriate authorization from the patient or under circumstances that do  not require patient authorization. You, the recipient, are obligated to  maintain it in a safe, secure and confidential manner. Re-disclosure  without additional patient consent or as permitted by law is prohibited.  Unauthorized re-disclosure or failure to maintain confidentiality could  subject you to penalties described in federal and state law.  If you have  received this report or facsimile in error, please notify the [facility] immediately and destroy the received document(s).    Material reviewed and Interpreted and  Report Electronically Signed by:  Herma ArdMamek Heydari Seradj MPH, CT(ASCP)  Mamek Heydari Seradj, CT(ASCP)  01/13/16 16:33  Electronic Signature derived from a single  controlled access password

## 2016-01-18 LAB — HPV HIGH RISK DNA PROBE, FEMALE
HPV High Risk Genotype 16: NOT DETECTED
HPV High Risk Genotype 18: NOT DETECTED
HPV Other High Risk Genotypes (Not type 16 or 18): NOT DETECTED

## 2016-01-29 ENCOUNTER — Encounter (HOSPITAL_BASED_OUTPATIENT_CLINIC_OR_DEPARTMENT_OTHER): Payer: Self-pay | Admitting: Advanced Practice Midwife

## 2016-05-30 ENCOUNTER — Emergency Department (HOSPITAL_BASED_OUTPATIENT_CLINIC_OR_DEPARTMENT_OTHER)
Admission: EM | Admit: 2016-05-30 | Discharge: 2016-05-30 | Disposition: A | Discharge status: Home / Self Care | Payer: Medicaid Other | Attending: Emergency Medicine | Admitting: Emergency Medicine

## 2016-05-30 ENCOUNTER — Encounter (HOSPITAL_BASED_OUTPATIENT_CLINIC_OR_DEPARTMENT_OTHER): Payer: Self-pay | Admitting: Emergency Medicine

## 2016-05-30 DIAGNOSIS — R509 Fever, unspecified: Secondary | ICD-10-CM | POA: Diagnosis not present

## 2016-05-30 DIAGNOSIS — R21 Rash and other nonspecific skin eruption: Secondary | ICD-10-CM | POA: Diagnosis not present

## 2016-05-30 DIAGNOSIS — R197 Diarrhea, unspecified: Secondary | ICD-10-CM | POA: Insufficient documentation

## 2016-05-30 DIAGNOSIS — R110 Nausea: Secondary | ICD-10-CM

## 2016-05-30 DIAGNOSIS — R112 Nausea with vomiting, unspecified: Secondary | ICD-10-CM | POA: Insufficient documentation

## 2016-05-30 LAB — COMPREHENSIVE METABOLIC PANEL
ALK PHOS: 78 U/L (ref 38–126)
ALT: 22 U/L (ref 14–54)
ANION GAP: 8 (ref 5–15)
AST: 22 U/L (ref 15–41)
Albumin: 4.1 g/dL (ref 3.5–5.0)
BUN: 5 mg/dL — ABNORMAL LOW (ref 6–20)
CALCIUM: 8.8 mg/dL — AB (ref 8.9–10.3)
CO2: 27 mmol/L (ref 22–32)
Chloride: 103 mmol/L (ref 101–111)
Creatinine, Ser: 0.71 mg/dL (ref 0.44–1.00)
Glucose, Bld: 96 mg/dL (ref 65–99)
Potassium: 3.5 mmol/L (ref 3.5–5.1)
Sodium: 138 mmol/L (ref 135–145)
Total Bilirubin: 0.4 mg/dL (ref 0.3–1.2)
Total Protein: 7.1 g/dL (ref 6.5–8.1)

## 2016-05-30 LAB — URINALYSIS, ROUTINE W REFLEX MICROSCOPIC
BILIRUBIN URINE: NEGATIVE
Glucose, UA: NEGATIVE mg/dL
HGB URINE DIPSTICK: NEGATIVE
KETONES UR: 15 mg/dL — AB
Nitrite: NEGATIVE
Protein, ur: NEGATIVE mg/dL
Specific Gravity, Urine: 1.02 (ref 1.005–1.030)
pH: 8 (ref 5.0–8.0)

## 2016-05-30 LAB — CBC WITH DIFFERENTIAL/PLATELET
BASOS PCT: 0 %
Basophils Absolute: 0 10*3/uL (ref 0.0–0.1)
EOS ABS: 0.3 10*3/uL (ref 0.0–0.7)
Eosinophils Relative: 4 %
HEMATOCRIT: 39.9 % (ref 36.0–46.0)
HEMOGLOBIN: 14 g/dL (ref 12.0–15.0)
Lymphocytes Relative: 44 %
Lymphs Abs: 3.1 10*3/uL (ref 0.7–4.0)
MCH: 28.9 pg (ref 26.0–34.0)
MCHC: 35.1 g/dL (ref 30.0–36.0)
MCV: 82.4 fL (ref 78.0–100.0)
MONOS PCT: 11 %
Monocytes Absolute: 0.7 10*3/uL (ref 0.1–1.0)
NEUTROS ABS: 2.8 10*3/uL (ref 1.7–7.7)
NEUTROS PCT: 41 %
Platelets: 123 10*3/uL — ABNORMAL LOW (ref 150–400)
RBC: 4.84 MIL/uL (ref 3.87–5.11)
RDW: 12.3 % (ref 11.5–15.5)
WBC: 6.9 10*3/uL (ref 4.0–10.5)

## 2016-05-30 LAB — URINALYSIS, MICROSCOPIC (REFLEX): RBC / HPF: NONE SEEN RBC/hpf (ref 0–5)

## 2016-05-30 LAB — PREGNANCY, URINE: PREG TEST UR: NEGATIVE

## 2016-05-30 MED ORDER — ONDANSETRON 4 MG PO TBDP: 4.0000 mg | ORAL_TABLET | Freq: Three times a day (TID) | ORAL | 0 refills | Status: DC | PRN

## 2016-05-30 MED ORDER — ONDANSETRON HCL 4 MG/2ML IJ SOLN
4.0000 mg | Freq: Once | INTRAMUSCULAR | Status: AC
  Administered 2016-05-30: 4 mg via INTRAVENOUS
  Filled 2016-05-30: qty 2

## 2016-05-30 NOTE — ED Notes (Signed)
Patient given ginger ale to drink 

## 2016-05-30 NOTE — Discharge Instructions (Signed)
Zofran as needed for nausea. Increase hydration.   Please seek immediate care if you develop any of the following symptoms: The pain does not go away.  You have a fever.  You keep throwing up and can't keep fluids down. You pass bloody or black tarry stools.  There is bright red blood in the stool.  There is rectal pain.  You do not seem to be getting better.  You have any questions or concerns.

## 2016-05-30 NOTE — ED Provider Notes (Signed)
[facility] Provider Note   CSN: [id number] Arrival date & time: 05/30/16  1825   By signing my name below, I, Clarisse Gouge, attest that this documentation has been prepared under the direction and in the presence of [facility], PA-C. Electronically signed, Clarisse Gouge, ED Scribe. 05/30/16. 6:54 PM.   History   Chief Complaint Chief Complaint  Patient presents with  . Nausea   The history is provided by the patient and medical records. No language interpreter was used.    Tanya Manning is an otherwise healthy 31 y.o. female who presents to the Emergency Department with concern for persistent nausea x 5 days. She states she initially became nauseous after eating a pizza at [employer] and she has been unable to consistently keep down fluids or solids since then. Associated non-bloody diarrhea and vomiting last occurring 2 days ago, subjective fever. At onset of symptoms, she had diffuse erythematous rash to upper extremities and back. She took OTC allergy medication and rash resolved. Pt reportedly given flagyl by her mother for her symptoms (3 doses) without relief. No other modifying factors noted. No abdominal pain, dysuria, urinary urgency/frequency or any other complaints noted at this time.  History reviewed. No pertinent past medical history.  There are no active problems to display for this patient.   Past Surgical History:  Procedure Laterality Date  . CESAREAN SECTION      OB History    No data available       Home Medications    Prior to Admission medications   Medication Sig Start Date End Date Taking? Authorizing Provider  ondansetron (ZOFRAN ODT) 4 MG disintegrating tablet Take 1 tablet (4 mg total) by mouth every 8 (eight) hours as needed for nausea or vomiting. 05/30/16   Chase Picket Kieron Kantner, PA-C    Family History No family history on file.  Social History Social History  Substance Use Topics  . Smoking status: Never Smoker  .  Smokeless tobacco: Never Used  . Alcohol use No     Allergies   Patient has no known allergies.   Review of Systems Review of Systems  Constitutional: Positive for appetite change and fever.  Gastrointestinal: Positive for diarrhea, nausea and vomiting. Negative for abdominal pain and blood in stool.  Genitourinary: Negative for difficulty urinating, dysuria, frequency, hematuria, urgency and vaginal discharge.  Skin: Positive for rash.  All other systems reviewed and are negative.    Physical Exam Updated Vital Signs BP 127/86 (BP Location: Left Arm)   Pulse 80   Temp 99.1 F (37.3 C) (Oral)   Resp 16   Ht  (1.676 m)   Wt 145 lb (65.8 kg)   LMP 05/02/2016   SpO2 100%   BMI 23.40 kg/m   Physical Exam  Constitutional: She is oriented to person, place, and time. She appears well-developed and well-nourished. No distress.  HENT:  Head: Normocephalic and atraumatic.  Cardiovascular: Normal rate, regular rhythm and normal heart sounds.   No murmur heard. Pulmonary/Chest: Effort normal and breath sounds normal. No respiratory distress.  Abdominal: Soft. She exhibits no distension. There is no tenderness.  No abdominal or CVA tenderness  Musculoskeletal: She exhibits no edema.  Neurological: She is alert and oriented to person, place, and time.  Skin: Skin is warm and dry.  Good cap refill  Nursing note and vitals reviewed.    ED Treatments / Results  DIAGNOSTIC STUDIES: Oxygen Saturation is 100% on RA, NL by my interpretation.  COORDINATION OF CARE: 6:51 PM-Discussed next steps with pt. Pt verbalized understanding and is agreeable with the plan. Will order labs and fluids.   Labs (all labs ordered are listed, but only abnormal results are displayed) Labs Reviewed  URINALYSIS, ROUTINE W REFLEX MICROSCOPIC - Abnormal; Notable for the following:       Result Value   Color, Urine AMBER (*)    APPearance CLOUDY (*)    Ketones, ur 15 (*)    Leukocytes, UA  SMALL (*)    All other components within normal limits  COMPREHENSIVE METABOLIC PANEL - Abnormal; Notable for the following:    BUN 5 (*)    Calcium 8.8 (*)    All other components within normal limits  CBC WITH DIFFERENTIAL/PLATELET - Abnormal; Notable for the following:    Platelets 123 (*)    All other components within normal limits  URINALYSIS, MICROSCOPIC (REFLEX) - Abnormal; Notable for the following:    Bacteria, UA RARE (*)    Squamous Epithelial / LPF 6-30 (*)    All other components within normal limits  PREGNANCY, URINE    EKG  EKG Interpretation None       Radiology No results found.  Procedures Procedures (including critical care time)  Medications Ordered in ED Medications  ondansetron (ZOFRAN) injection 4 mg (4 mg Intravenous Given 05/30/16 1902)     Initial Impression / Assessment and Plan / ED Course  I have reviewed the triage vital signs and the nursing notes.  Pertinent labs & imaging results that were available during my care of the patient were reviewed by me and considered in my medical decision making (see chart for details).    Tanya Manning is a 31 y.o. female who presents to ED for persistent nausea 5 days. She had vomiting and diarrhea at onset which is now resolved, however nausea has persisted. On exam, she is afebrile, hemodynamically stable with benign abdominal exam. Labs and urine were reassuring. Zofran given in ED. Patient reevaluated and feels improved. Nausea resolved. Able to orally rehydrate while in ER. Discussed importance of finding PCP in the area. Abdominal return precautions discussed. All questions answered.    Final Clinical Impressions(s) / ED Diagnoses   Final diagnoses:  Nausea    New Prescriptions Discharge Medication List as of 05/30/2016  8:28 PM    START taking these medications   Details  ondansetron (ZOFRAN ODT) 4 MG disintegrating tablet Take 1 tablet (4 mg total) by mouth every 8 (eight) hours  as needed for nausea or vomiting., Starting Tue 05/30/2016, Print       I personally performed the services described in this documentation, which was scribed in my presence. The recorded information has been reviewed and is accurate.    [facility] Marcio Hoque, PA-C 05/30/16 1610    Tilden Fossa, MD 05/31/16 Georgiann Mohs

## 2016-05-30 NOTE — ED Triage Notes (Signed)
Pt had an allergic reaction on Friday with diffuse hives, took allergy medicine and the hives went away. Pt states she has had ongoing nausea since then, denies vomiting, denies pain.

## 2016-08-17 ENCOUNTER — Emergency Department (HOSPITAL_BASED_OUTPATIENT_CLINIC_OR_DEPARTMENT_OTHER)
Admission: EM | Admit: 2016-08-17 | Discharge: 2016-08-18 | Disposition: A | Discharge status: Home / Self Care | Payer: Medicaid Other | Attending: Emergency Medicine | Admitting: Emergency Medicine

## 2016-08-17 ENCOUNTER — Encounter (HOSPITAL_BASED_OUTPATIENT_CLINIC_OR_DEPARTMENT_OTHER): Payer: Self-pay | Admitting: *Deleted

## 2016-08-17 DIAGNOSIS — L509 Urticaria, unspecified: Secondary | ICD-10-CM

## 2016-08-17 DIAGNOSIS — T7840XA Allergy, unspecified, initial encounter: Secondary | ICD-10-CM

## 2016-08-17 DIAGNOSIS — Z79899 Other long term (current) drug therapy: Secondary | ICD-10-CM | POA: Insufficient documentation

## 2016-08-17 MED ORDER — ONDANSETRON 4 MG PO TBDP
4.0000 mg | ORAL_TABLET | Freq: Once | ORAL | Status: AC
Start: 2016-08-17 — End: 2016-08-17
  Administered 2016-08-17: 4 mg via ORAL
  Filled 2016-08-17: qty 1

## 2016-08-17 MED ORDER — PREDNISONE 50 MG PO TABS
60.0000 mg | ORAL_TABLET | Freq: Once | ORAL | Status: AC
  Administered 2016-08-17: 60 mg via ORAL
  Filled 2016-08-17: qty 1

## 2016-08-17 MED ORDER — DIPHENHYDRAMINE HCL 25 MG PO CAPS
ORAL_CAPSULE | ORAL | Status: AC
  Filled 2016-08-17: qty 2

## 2016-08-17 MED ORDER — FAMOTIDINE 20 MG PO TABS
20.0000 mg | ORAL_TABLET | Freq: Once | ORAL | Status: AC
  Administered 2016-08-17: 20 mg via ORAL
  Filled 2016-08-17: qty 1

## 2016-08-17 MED ORDER — DIPHENHYDRAMINE HCL 25 MG PO CAPS
50.0000 mg | ORAL_CAPSULE | Freq: Once | ORAL | Status: AC
  Administered 2016-08-17: 50 mg via ORAL

## 2016-08-17 NOTE — ED Triage Notes (Signed)
Hives started an hour ago. She took a Zyrtec. She is speaking in complete sentences. Oxygen sats are 100%. States she came to the ED because her throat feels funny. States she has been getting hives everyday for the past 3 months.

## 2016-08-17 NOTE — ED Notes (Signed)
Patient also claimed that she is not coughing but she is forming some phlegm.  Lungs is clear.  She felt that her throat is swollen and her left side of the face is swollen and stated that she has difficulty breathing.  Benadryl was given in triage that helps her with her breathing.

## 2016-08-17 NOTE — ED Provider Notes (Signed)
[facility] Provider Note   CSN: [id number] Arrival date & time: 08/17/16  2145 By signing my name below, I, Levon HedgerElizabeth Hall, attest that this documentation has been prepared under the direction and in the presence of Azalia Bilisampos, Sidhant Helderman, MD . Electronically Signed: Levon HedgerElizabeth Hall, Scribe. 08/17/2016. 11:48 PM.   History   Chief Complaint Chief Complaint  Patient presents with  . Allergic Reaction   HPI Tanya Manning is a 31 y.o. female with a history of seasonal allergies who presents to the Emergency Department complaining of sudden onset, progressively improving suspected allergic reaction onset tonight. She reports associated hives, nasal congestion, throat swelling sensation, difficulty breathing and difficulty swallowing. She was given a Benadryl in triage with moderate relief of symptoms. Pt states her breathing has improved while in the ED tonight. She is unsure of what could have caused an allergic reaction tonight. Pt has no other acute complaints or associated symptoms at this time.     The history is provided by the patient. No language interpreter was used.    History reviewed. No pertinent past medical history.  There are no active problems to display for this patient.   Past Surgical History:  Procedure Laterality Date  . CESAREAN SECTION      OB History    No data available      Home Medications    Prior to Admission medications   Medication Sig Start Date End Date Taking? Authorizing Provider  Cetirizine HCl (ZYRTEC PO) Take by mouth.   Yes [provider]  ondansetron (ZOFRAN ODT) 4 MG disintegrating tablet Take 1 tablet (4 mg total) by mouth every 8 (eight) hours as needed for nausea or vomiting. 05/30/16   Ward, Chase PicketJaime Pilcher, PA-C    Family History No family history on file.  Social History Social History  Substance Use Topics  . Smoking status: Never Smoker  . Smokeless tobacco: Never Used  . Alcohol use No      Allergies   Patient has no known allergies.   Review of Systems Review of Systems All systems reviewed and are negative for acute change except as noted in the HPI.  Physical Exam Updated Vital Signs BP 136/87 (BP Location: Left Arm)   Pulse 81   Temp 98.1 F (36.7 C)   Resp 19   Ht 5\' 6"  (1.676 m)   Wt 165 lb (74.8 kg)   LMP 08/16/2016   SpO2 100%   BMI 26.63 kg/m   Physical Exam  Constitutional: She is oriented to person, place, and time. She appears well-developed and well-nourished. No distress.  HENT:  Head: Normocephalic and atraumatic.  Eyes: EOM are normal.  Neck: Normal range of motion.  Cardiovascular: Normal rate, regular rhythm and normal heart sounds.   Pulmonary/Chest: Effort normal and breath sounds normal. No stridor.  Abdominal: Soft. She exhibits no distension. There is no tenderness.  Musculoskeletal: Normal range of motion.  Neurological: She is alert and oriented to person, place, and time.  Skin: Skin is warm and dry.  Hives present on arms, legs and abdomen   Psychiatric: She has a normal mood and affect. Judgment normal.  Nursing note and vitals reviewed.  ED Treatments / Results  DIAGNOSTIC STUDIES:  Oxygen Saturation is 100% on RA, normal by my interpretation.    COORDINATION OF CARE:  11:29 PM Discussed treatment plan with pt at bedside and pt agreed to plan.   Labs (all labs ordered are listed, but only abnormal results are displayed)  Labs Reviewed - No data to display  EKG  EKG Interpretation None      Radiology No results found.  Procedures Procedures (including critical care time)  Medications Ordered in ED Medications  diphenhydrAMINE (BENADRYL) capsule 50 mg (50 mg Oral Given 08/17/16 2200)  ondansetron (ZOFRAN-ODT) disintegrating tablet 4 mg (4 mg Oral Given 08/17/16 2204)  famotidine (PEPCID) tablet 20 mg (20 mg Oral Given 08/17/16 2333)  predniSONE (DELTASONE) tablet 60 mg (60 mg Oral Given 08/17/16 2333)      Initial Impression / Assessment and Plan / ED Course  I have reviewed the triage vital signs and the nursing notes.  Pertinent labs & imaging results that were available during my care of the patient were reviewed by me and considered in my medical decision making (see chart for details).     Improvement in symptoms.  Hives resolved.  No airway compromise.  Discharge home in good condition with symptomatic care.  She understands return to the ER for new or worsening symptoms  Final Clinical Impressions(s) / ED Diagnoses   Final diagnoses:  Allergic reaction, initial encounter  Urticaria    New Prescriptions Discharge Medication List as of 08/18/2016 12:57 AM    START taking these medications   Details  diphenhydrAMINE (BENADRYL) 25 MG tablet Take 1 tablet (25 mg total) by mouth every 6 (six) hours., Starting Fri 08/18/2016, Print    famotidine (PEPCID) 20 MG tablet Take 1 tablet (20 mg total) by mouth 2 (two) times daily., Starting Fri 08/18/2016, Print    predniSONE (DELTASONE) 20 MG tablet Take 2 tablets (40 mg total) by mouth daily., Starting Fri 08/18/2016, Until Mon 08/21/2016, Print       I personally performed the services described in this documentation, which was scribed in my presence. The recorded information has been reviewed and is accurate.        Azalia Bilis, MD 08/18/16 [phone]

## 2016-08-18 MED ORDER — FAMOTIDINE 20 MG PO TABS: 20.0000 mg | ORAL_TABLET | Freq: Two times a day (BID) | ORAL | 0 refills | Status: DC

## 2016-08-18 MED ORDER — PREDNISONE 20 MG PO TABS: 40.0000 mg | ORAL_TABLET | Freq: Every day | ORAL | 0 refills | Status: DC

## 2016-08-18 MED ORDER — DIPHENHYDRAMINE HCL 25 MG PO TABS: 25.0000 mg | ORAL_TABLET | Freq: Four times a day (QID) | ORAL | 0 refills | Status: DC

## 2016-08-18 NOTE — ED Notes (Signed)
ED Provider at bedside. 

## 2016-08-19 ENCOUNTER — Encounter (HOSPITAL_BASED_OUTPATIENT_CLINIC_OR_DEPARTMENT_OTHER): Payer: Self-pay | Admitting: Emergency Medicine

## 2016-08-19 ENCOUNTER — Emergency Department (HOSPITAL_BASED_OUTPATIENT_CLINIC_OR_DEPARTMENT_OTHER)
Admission: EM | Admit: 2016-08-19 | Discharge: 2016-08-19 | Disposition: A | Discharge status: Home / Self Care | Payer: Medicaid Other | Attending: Emergency Medicine | Admitting: Emergency Medicine

## 2016-08-19 DIAGNOSIS — T7840XA Allergy, unspecified, initial encounter: Secondary | ICD-10-CM

## 2016-08-19 DIAGNOSIS — T783XXA Angioneurotic edema, initial encounter: Secondary | ICD-10-CM | POA: Insufficient documentation

## 2016-08-19 MED ORDER — PREDNISONE 10 MG PO TABS: 20.0000 mg | ORAL_TABLET | Freq: Two times a day (BID) | ORAL | 0 refills | Status: DC

## 2016-08-19 MED ORDER — FAMOTIDINE IN NACL 20-0.9 MG/50ML-% IV SOLN
20.0000 mg | Freq: Once | INTRAVENOUS | Status: AC
  Administered 2016-08-19: 20 mg via INTRAVENOUS
  Filled 2016-08-19: qty 50

## 2016-08-19 MED ORDER — METHYLPREDNISOLONE SODIUM SUCC 125 MG IJ SOLR
80.0000 mg | Freq: Once | INTRAMUSCULAR | Status: AC
  Administered 2016-08-19: 80 mg via INTRAVENOUS
  Filled 2016-08-19: qty 2

## 2016-08-19 MED ORDER — DIPHENHYDRAMINE HCL 50 MG/ML IJ SOLN
25.0000 mg | Freq: Once | INTRAMUSCULAR | Status: AC
Start: 2016-08-19 — End: 2016-08-19
  Administered 2016-08-19: 25 mg via INTRAVENOUS
  Filled 2016-08-19: qty 1

## 2016-08-19 NOTE — Discharge Instructions (Signed)
Continue prednisone as prescribed.  Benadryl 25 mg every 6 hours for the next 2 days.  Return to the emergency department for difficulty breathing or swallowing, or for other new and concerning symptoms.

## 2016-08-19 NOTE — ED Notes (Signed)
Pt ambulatory unassisted, in NAD. 

## 2016-08-19 NOTE — ED Triage Notes (Signed)
Pt returns today with continued allergic reaction sxs; sts she is taking medications as directed, but sxs keep flaring up; today she has upper lip edema, which is new

## 2016-08-19 NOTE — ED Provider Notes (Signed)
[facility] Provider Note   CSN: [id number] Arrival date & time: 08/19/16  1451     History   Chief Complaint Chief Complaint  Patient presents with  . Allergic Reaction    angioedema     HPI Era Skeenlejandra Cortes Espino is a 31 y.o. female.  Patient is a 31 year old female with past medical history of allergies. She presents today for evaluation of lip swelling. She was seen here 2 days ago for hives. This was treated with oral medications. It initially improved, but now she is noticing significant swelling to her upper lip and a rough feeling to the back of her throat. She denies any difficulty breathing or swallowing. There are no new contacts or exposures.   The history is provided by the patient.  Allergic Reaction  Presenting symptoms: swelling   Presenting symptoms: no difficulty breathing and no difficulty swallowing   Severity:  Moderate Duration:  2 hours Prior allergic episodes:  No prior episodes Worsened by:  Nothing Ineffective treatments:  Antihistamines and steroids   History reviewed. No pertinent past medical history.  There are no active problems to display for this patient.   Past Surgical History:  Procedure Laterality Date  . CESAREAN SECTION      OB History    No data available       Home Medications    Prior to Admission medications   Medication Sig Start Date End Date Taking? Authorizing Provider  Cetirizine HCl (ZYRTEC PO) Take by mouth.    [provider]  diphenhydrAMINE (BENADRYL) 25 MG tablet Take 1 tablet (25 mg total) by mouth every 6 (six) hours. 08/18/16   Azalia Bilisampos, Kevin, MD  famotidine (PEPCID) 20 MG tablet Take 1 tablet (20 mg total) by mouth 2 (two) times daily. 08/18/16   Azalia Bilisampos, Kevin, MD  ondansetron (ZOFRAN ODT) 4 MG disintegrating tablet Take 1 tablet (4 mg total) by mouth every 8 (eight) hours as needed for nausea or vomiting. 05/30/16   Ward, Chase PicketJaime Pilcher, PA-C  predniSONE (DELTASONE) 20 MG tablet Take  2 tablets (40 mg total) by mouth daily. 08/18/16 08/21/16  Azalia Bilisampos, Kevin, MD    Family History No family history on file.  Social History Social History  Substance Use Topics  . Smoking status: Never Smoker  . Smokeless tobacco: Never Used  . Alcohol use No     Allergies   Patient has no known allergies.   Review of Systems Review of Systems  HENT: Negative for trouble swallowing.   All other systems reviewed and are negative.    Physical Exam Updated Vital Signs BP 120/77 (BP Location: Left Arm)   Pulse 72   Temp 99.2 F (37.3 C) (Oral)   Resp 16   LMP 08/16/2016   SpO2 100%   Physical Exam  Constitutional: She is oriented to person, place, and time. She appears well-developed and well-nourished. No distress.  HENT:  Head: Normocephalic and atraumatic.  There is swelling of the upper lip. Posterior oropharynx appears clear with no stridor.  Neck: Normal range of motion. Neck supple.  Cardiovascular: Normal rate and regular rhythm.  Exam reveals no gallop and no friction rub.   No murmur heard. Pulmonary/Chest: Effort normal and breath sounds normal. No stridor. No respiratory distress. She has no wheezes.  Abdominal: Soft. Bowel sounds are normal. She exhibits no distension. There is no tenderness.  Musculoskeletal: Normal range of motion.  Neurological: She is alert and oriented to person, place, and time.  Skin: Skin  is warm and dry. She is not diaphoretic.  Nursing note and vitals reviewed.    ED Treatments / Results  Labs (all labs ordered are listed, but only abnormal results are displayed) Labs Reviewed - No data to display  EKG  EKG Interpretation None       Radiology No results found.  Procedures Procedures (including critical care time)  Medications Ordered in ED Medications  methylPREDNISolone sodium succinate (SOLU-MEDROL) 125 mg/2 mL injection 80 mg (not administered)  diphenhydrAMINE (BENADRYL) injection 25 mg (not administered)    famotidine (PEPCID) IVPB 20 mg premix (not administered)     Initial Impression / Assessment and Plan / ED Course  I have reviewed the triage vital signs and the nursing notes.  Pertinent labs & imaging results that were available during my care of the patient were reviewed by me and considered in my medical decision making (see chart for details).  Patient presents with swelling of the upper lip that appears allergic in nature, also possibly related to angioedema. She was given steroids and antihistamines and it is significantly improving. She has no stridor and no evidence for airway involvement. She will be discharged with continued prednisone, antihistamines, and return as needed.  Final Clinical Impressions(s) / ED Diagnoses   Final diagnoses:  None    New Prescriptions New Prescriptions   No medications on file     Geoffery Lyons, MD 08/19/16 1916

## 2019-08-27 ENCOUNTER — Ambulatory Visit (INDEPENDENT_AMBULATORY_CARE_PROVIDER_SITE_OTHER): Payer: Medicaid Other | Admitting: Allergy and Immunology

## 2019-08-27 ENCOUNTER — Encounter (HOSPITAL_BASED_OUTPATIENT_CLINIC_OR_DEPARTMENT_OTHER): Payer: Self-pay | Admitting: Emergency Medicine

## 2019-08-27 ENCOUNTER — Other Ambulatory Visit: Payer: Self-pay

## 2019-08-27 ENCOUNTER — Other Ambulatory Visit: Payer: Self-pay | Admitting: Allergy and Immunology

## 2019-08-27 ENCOUNTER — Emergency Department (HOSPITAL_BASED_OUTPATIENT_CLINIC_OR_DEPARTMENT_OTHER)
Admission: EM | Admit: 2019-08-27 | Discharge: 2019-08-27 | Disposition: A | Discharge status: Home / Self Care | Payer: Medicaid Other | Attending: Emergency Medicine | Admitting: Emergency Medicine

## 2019-08-27 ENCOUNTER — Encounter: Payer: Self-pay | Admitting: Allergy and Immunology

## 2019-08-27 VITALS — BP 106/80 | HR 104 | Resp 16 | Ht 66.0 in | Wt 169.0 lb

## 2019-08-27 DIAGNOSIS — R0609 Other forms of dyspnea: Secondary | ICD-10-CM | POA: Diagnosis not present

## 2019-08-27 DIAGNOSIS — R21 Rash and other nonspecific skin eruption: Secondary | ICD-10-CM

## 2019-08-27 DIAGNOSIS — T783XXA Angioneurotic edema, initial encounter: Secondary | ICD-10-CM | POA: Diagnosis not present

## 2019-08-27 DIAGNOSIS — L508 Other urticaria: Secondary | ICD-10-CM | POA: Insufficient documentation

## 2019-08-27 DIAGNOSIS — T7840XD Allergy, unspecified, subsequent encounter: Secondary | ICD-10-CM

## 2019-08-27 DIAGNOSIS — T783XXD Angioneurotic edema, subsequent encounter: Secondary | ICD-10-CM

## 2019-08-27 DIAGNOSIS — L500 Allergic urticaria: Secondary | ICD-10-CM | POA: Diagnosis not present

## 2019-08-27 DIAGNOSIS — K2970 Gastritis, unspecified, without bleeding: Secondary | ICD-10-CM

## 2019-08-27 DIAGNOSIS — Z79899 Other long term (current) drug therapy: Secondary | ICD-10-CM | POA: Diagnosis not present

## 2019-08-27 DIAGNOSIS — J310 Chronic rhinitis: Secondary | ICD-10-CM

## 2019-08-27 DIAGNOSIS — L509 Urticaria, unspecified: Secondary | ICD-10-CM | POA: Insufficient documentation

## 2019-08-27 DIAGNOSIS — T7840XA Allergy, unspecified, initial encounter: Secondary | ICD-10-CM | POA: Insufficient documentation

## 2019-08-27 MED ORDER — METHYLPREDNISOLONE SODIUM SUCC 125 MG IJ SOLR
INTRAMUSCULAR | Status: AC
  Administered 2019-08-27: 05:00:00 125 mg via INTRAVENOUS
  Filled 2019-08-27: qty 2

## 2019-08-27 MED ORDER — FAMOTIDINE 20 MG PO TABS: 20.0000 mg | ORAL_TABLET | Freq: Two times a day (BID) | ORAL | 5 refills | Status: DC

## 2019-08-27 MED ORDER — DIPHENHYDRAMINE HCL 50 MG/ML IJ SOLN: 50.0000 mg | Freq: Once | INTRAMUSCULAR | Status: AC

## 2019-08-27 MED ORDER — METHYLPREDNISOLONE SODIUM SUCC 125 MG IJ SOLR: 125.0000 mg | Freq: Once | INTRAMUSCULAR | Status: AC

## 2019-08-27 MED ORDER — EPINEPHRINE 0.3 MG/0.3ML IJ SOAJ: 0.3000 mg | INTRAMUSCULAR | 1 refills | Status: DC | PRN

## 2019-08-27 MED ORDER — FAMOTIDINE IN NACL 20-0.9 MG/50ML-% IV SOLN
INTRAVENOUS | Status: AC
  Administered 2019-08-27: 05:00:00 20 mg via INTRAVENOUS
  Filled 2019-08-27: qty 50

## 2019-08-27 MED ORDER — ALBUTEROL SULFATE HFA 108 (90 BASE) MCG/ACT IN AERS: 2.0000 | INHALATION_SPRAY | RESPIRATORY_TRACT | 1 refills | Status: DC | PRN

## 2019-08-27 MED ORDER — EPINEPHRINE 0.3 MG/0.3ML IJ SOAJ
0.3000 mg | Freq: Once | INTRAMUSCULAR | Status: AC
  Administered 2019-08-27: 05:00:00 0.3 mg via INTRAMUSCULAR
  Filled 2019-08-27: qty 0.3

## 2019-08-27 MED ORDER — ALBUTEROL SULFATE HFA 108 (90 BASE) MCG/ACT IN AERS
2.0000 | INHALATION_SPRAY | RESPIRATORY_TRACT | 1 refills | Status: DC | PRN
Start: 2019-08-27 — End: 2019-08-27

## 2019-08-27 MED ORDER — LEVOCETIRIZINE DIHYDROCHLORIDE 5 MG PO TABS: 5.0000 mg | ORAL_TABLET | Freq: Every evening | ORAL | 5 refills | Status: DC

## 2019-08-27 MED ORDER — SODIUM CHLORIDE 0.9 % IV SOLN: INTRAVENOUS | Status: DC | PRN

## 2019-08-27 MED ORDER — DIPHENHYDRAMINE HCL 50 MG/ML IJ SOLN
INTRAMUSCULAR | Status: AC
  Administered 2019-08-27: 05:00:00 50 mg via INTRAVENOUS
  Filled 2019-08-27: qty 1

## 2019-08-27 MED ORDER — DIPHENHYDRAMINE HCL 25 MG PO TABS: 25.0000 mg | ORAL_TABLET | Freq: Four times a day (QID) | ORAL | 0 refills | Status: DC | PRN

## 2019-08-27 MED ORDER — FAMOTIDINE IN NACL 20-0.9 MG/50ML-% IV SOLN: 20.0000 mg | Freq: Once | INTRAVENOUS | Status: AC

## 2019-08-27 MED ORDER — PREDNISONE 20 MG PO TABS: ORAL_TABLET | ORAL | 0 refills | Status: DC

## 2019-08-27 NOTE — Patient Instructions (Addendum)
Allergic urticaria Unclear etiology.  NSAIDs and emotional stress commonly exacerbate urticaria but are not the underlying etiology in this case. Physical urticarias are negative by history (i.e. pressure-induced, temperature, vibration, solar, etc.). History and lesions are not consistent with urticaria pigmentosa so I am not suspicious for mastocytosis. We will rule out other potential etiologies with labs. For symptom relief, patient is to take oral antihistamines as directed.  Take the prednisone course to completion as prescribed by the emergency department  The following labs have been ordered: FCeRI antibody, anti-thyroglobulin antibody, thyroid peroxidase antibody, tryptase, C4 level, H. pylori serology, CBC, CMP, ESR, ANA, and alpha-gal panel.  These labs are not to be drawn until 2 weeks after the final dose of prednisone has been taken.  The patient will be called with further recommendations after lab results have returned.  If labs are unrevealing, the patient will return for allergy skin testing after having been off of antihistamines for 3 days.  Instructions have been discussed and provided for H1/H2 receptor blockade with titration to find lowest effective dose.  A prescription has been provided for levocetirizine (Xyzal), 5 mg daily as needed.  A prescription has been provided for famotidine (Pepcid), 52m twice daily as needed.  Should there be a significant increase or change in symptoms, a journal is to be kept recording any foods eaten, beverages consumed, medications taken within a 6 hour period prior to the onset of symptoms, as well as record activities being performed, and environmental conditions. For any symptoms concerning for anaphylaxis, epinephrine is to be administered and 911 is to be called immediately.  Angioedema Associated angioedema occurs in up to 50% of patients with chronic urticaria.  Treatment/diagnostic plan as outlined above.  Other forms of  dyspnea Spirometry today reveals significant postbronchodilator improvement (720 mL, 24%).  A prescription has been provided for albuterol HFA, 1 to 2 inhalations every 4-6 hours if needed.  To maximize pulmonary deposition, a spacer has been provided along with instructions for its proper administration with an HFA inhaler.  Subjective and objective measures of pulmonary function will be followed and the treatment plan will be adjusted accordingly.  Chronic rhinitis  A prescription has been provided for azelastine nasal spray, 1-2 sprays per nostril 2 times daily as needed. Proper nasal spray technique has been discussed and demonstrated.   Nasal saline lavage (NeilMed) has been recommended as needed and prior to medicated nasal sprays along with instructions for proper administration.  For thick post nasal drainage, add guaifenesin 1200 mg (Mucinex Maximum Strength)  twice daily as needed with adequate hydration as discussed.  We were unable to perform skin tests today due to recent administration of antihistamine.   The patient will be scheduled to return in the near future for allergy skin testing after having been off of antihistamines for at least 3 days.    To control symptoms while off of antihistamines, prednisone has been provided: 180mper day for 3 days.  Further recommendations will be made at that time based upon skin test results.   When lab results have returned you will be called with further recommendations. With the newly implemented Cures Act, the labs may be visible to you at the same time they become visible to [place], the results will typically not be addressed until all of the results are back, so please be patient.  Until you have heard from [place] continue the treatment plan as outlined on your take home sheet.  Urticaria (Hives)  .  Levocetirizine (Xyzal) 5 mg twice a day and famotidine (Pepcid) 20 mg twice a day. If no symptoms for 7-14 days then  decrease to. . Levocetirizine (Xyzal) 5 mg twice a day and famotidine (Pepcid) 20 mg once a day.  If no symptoms for 7-14 days then decrease to. . Levocetirizine (Xyzal) 5 mg twice a day.  If no symptoms for 7-14 days then decrease to. . Levocetirizine (Xyzal) 5 mg once a day.  May use Benadryl (diphenhydramine) as needed for breakthrough symptoms       If symptoms return, then step up dosage  To avoid diminishing benefit with daily use (tachyphylaxis) of second generation antihistamine, consider alternating every few months between fexofenadine (Allegra) and levocetirizine (Xyzal).

## 2019-08-27 NOTE — ED Notes (Signed)
Eyelids and lips are still swollen but per patient, it is better than when she first came.

## 2019-08-27 NOTE — Assessment & Plan Note (Signed)
   A prescription has been provided for azelastine nasal spray, 1-2 sprays per nostril 2 times daily as needed. Proper nasal spray technique has been discussed and demonstrated.   Nasal saline lavage (NeilMed) has been recommended as needed and prior to medicated nasal sprays along with instructions for proper administration.  For thick post nasal drainage, add guaifenesin 1200 mg (Mucinex Maximum Strength)  twice daily as needed with adequate hydration as discussed.  We were unable to perform skin tests today due to recent administration of antihistamine.   The patient will be scheduled to return in the near future for allergy skin testing after having been off of antihistamines for at least 3 days.    To control symptoms while off of antihistamines, prednisone has been provided: 10mg  per day for 3 days.  Further recommendations will be made at that time based upon skin test results.

## 2019-08-27 NOTE — ED Triage Notes (Signed)
Pt has swelling of lips that started at midnight, she went to sleep and awoke with feeling like her throat was swelling. Pt has allergy apt this am.

## 2019-08-27 NOTE — Assessment & Plan Note (Signed)
Associated angioedema occurs in up to 50% of patients with chronic urticaria.  Treatment/diagnostic plan as outlined above. 

## 2019-08-27 NOTE — Telephone Encounter (Signed)
Pt. states albuterol was not sent in for her.

## 2019-08-27 NOTE — Assessment & Plan Note (Signed)
Spirometry today reveals significant postbronchodilator improvement (720 mL, 24%).  A prescription has been provided for albuterol HFA, 1 to 2 inhalations every 4-6 hours if needed.  To maximize pulmonary deposition, a spacer has been provided along with instructions for its proper administration with an HFA inhaler.  Subjective and objective measures of pulmonary function will be followed and the treatment plan will be adjusted accordingly.

## 2019-08-27 NOTE — ED Notes (Signed)
ED Provider at bedside. 

## 2019-08-27 NOTE — ED Provider Notes (Signed)
[facility] EMERGENCY DEPARTMENT Provider Note   CSN: [id number] Arrival date & time: 08/27/19  0433     History Chief Complaint  Patient presents with  . Allergic Reaction    Tanya Manning is a 34 y.o. female.  Patient has a history of hives, edema and allergies.  She is actually scheduled to see the allergist today as an initial visit and she is was to be off antihistamines for 72 hours but throughout the night she started having worsening periorbital edema, facial swelling, throat swelling, difficulty breathing, hives, nausea and abdominal discomfort.  No syncope or other associated symptoms.  No new contacts with anything.  She did not try thing before she came in because she had that appointment today at 0 30.   Allergic Reaction Presenting symptoms: difficulty breathing, itching, rash and swelling        History reviewed. No pertinent past medical history.  There are no problems to display for this patient.   Past Surgical History:  Procedure Laterality Date  . CESAREAN SECTION       OB History   No obstetric history on file.     No family history on file.  Social History   Tobacco Use  . Smoking status: Never Smoker  . Smokeless tobacco: Never Used  Substance Use Topics  . Alcohol use: No  . Drug use: No    Home Medications Prior to Admission medications   Medication Sig Start Date End Date Taking? Authorizing Provider  Cetirizine HCl (ZYRTEC PO) Take by mouth.   Yes [provider]    Allergies    Patient has no known allergies.  Review of Systems   Review of Systems  Skin: Positive for itching and rash.  All other systems reviewed and are negative.   Physical Exam Updated Vital Signs BP 117/78   Pulse (!) 119   Temp 98.4 F (36.9 C) (Oral)   Resp 16   Ht 5\' 6"  (1.676 m)   Wt 75.8 kg   SpO2 98%   BMI 26.95 kg/m   Physical Exam Vitals and nursing note reviewed.  Constitutional:      Appearance: She  is well-developed.  HENT:     Head: Normocephalic and atraumatic.     Mouth/Throat:     Mouth: Mucous membranes are moist.     Pharynx: Oropharynx is clear.  Cardiovascular:     Rate and Rhythm: Normal rate and regular rhythm.  Pulmonary:     Effort: No respiratory distress.     Breath sounds: No stridor.  Abdominal:     General: Abdomen is flat. There is no distension.  Musculoskeletal:     Cervical back: Normal range of motion.  Skin:    General: Skin is warm and dry.     Findings: Erythema and rash present.  Neurological:     General: No focal deficit present.     Mental Status: She is alert.     ED Results / Procedures / Treatments   Labs (all labs ordered are listed, but only abnormal results are displayed) Labs Reviewed - No data to display  EKG None  Radiology No results found.  Procedures .Critical Care Performed by: , MD Authorized by: Marily Memos, MD   Critical care provider statement:    Critical care time (minutes):  45   Critical care was necessary to treat or prevent imminent or life-threatening deterioration of the following conditions: Anaphylactic allergic reaction requiring emergent epinephrine administration, prolonged  monitoring.   Critical care was time spent personally by me on the following activities:  Discussions with consultants, evaluation of patient's response to treatment, examination of patient, ordering and performing treatments and interventions, ordering and review of laboratory studies, ordering and review of radiographic studies, pulse oximetry, re-evaluation of patient's condition, obtaining history from patient or surrogate and review of old charts   (including critical care time)  Medications Ordered in ED Medications  0.9 %  sodium chloride infusion ( Intravenous New Bag/Given 08/27/19 0451)  EPINEPHrine (EPI-PEN) injection 0.3 mg (0.3 mg Intramuscular Given 08/27/19 0456)  diphenhydrAMINE (BENADRYL) injection 50  mg (50 mg Intravenous Given 08/27/19 0449)  famotidine (PEPCID) IVPB 20 mg premix (20 mg Intravenous New Bag/Given 08/27/19 0453)  methylPREDNISolone sodium succinate (SOLU-MEDROL) 125 mg/2 mL injection 125 mg (125 mg Intravenous Given 08/27/19 0450)    ED Course  I have reviewed the triage vital signs and the nursing notes.  Pertinent labs & imaging results that were available during my care of the patient were reviewed by me and considered in my medical decision making (see chart for details).    MDM Rules/Calculators/A&P                          Patient with anaphylactic appearing reaction with erythema, hives, lip edema, facial edema and scratchy throat with a feeling of her throat closing.  Administer an Flygt a cocktail to include epinephrine, steroids, antihistamines.  Observe the patient emergency room for approximately 3.5 hours with persistent sniffing and improvement.  Patient was supposed to follow-up with allergist today however I sent a message in the computer just let them know why we had to give her antihistamines. epipen prescribed.  Final Clinical Impression(s) / ED Diagnoses Final diagnoses:  None    Rx / DC Orders ED Discharge Orders    None       Alexah Kivett, Barbara Cower, MD 08/28/19 [phone]

## 2019-08-27 NOTE — Telephone Encounter (Signed)
Spoke with the pharmacist and the proair hfa order has been rec'd. I then called the pt. To let her know the pharmacy does have her order and should be ready for pick up shortley. Pt.. feeling a little better.

## 2019-08-27 NOTE — Assessment & Plan Note (Addendum)
Unclear etiology.  NSAIDs and emotional stress commonly exacerbate urticaria but are not the underlying etiology in this case. Physical urticarias are negative by history (i.e. pressure-induced, temperature, vibration, solar, etc.). History and lesions are not consistent with urticaria pigmentosa so I am not suspicious for mastocytosis. We will rule out other potential etiologies with labs. For symptom relief, patient is to take oral antihistamines as directed.  Take the prednisone course to completion as prescribed by the emergency department  The following labs have been ordered: FCeRI antibody, anti-thyroglobulin antibody, thyroid peroxidase antibody, tryptase, C4 level, H. pylori serology, CBC, CMP, ESR, ANA, and alpha-gal panel.  These labs are not to be drawn until 2 weeks after the final dose of prednisone has been taken.  The patient will be called with further recommendations after lab results have returned.  If labs are unrevealing, the patient will return for allergy skin testing after having been off of antihistamines for 3 days.  Instructions have been discussed and provided for H1/H2 receptor blockade with titration to find lowest effective dose.  A prescription has been provided for levocetirizine (Xyzal), 5 mg daily as needed.  A prescription has been provided for famotidine (Pepcid), 38m twice daily as needed.  Should there be a significant increase or change in symptoms, a journal is to be kept recording any foods eaten, beverages consumed, medications taken within a 6 hour period prior to the onset of symptoms, as well as record activities being performed, and environmental conditions. For any symptoms concerning for anaphylaxis, epinephrine is to be administered and 911 is to be called immediately.

## 2019-08-27 NOTE — Progress Notes (Signed)
New Patient Note  RE: Tanya Manning MRN: [id number] DOB: August 21, 1985 Date of Office Visit: 08/27/2019  Referring provider: Glendon Axe, MD Primary care provider: Medicine, [facility]  Chief Complaint: Urticaria and Angioedema  History of present illness: Tanya Manning is a 34 y.o. female seen today in consultation requested by Glendon Axe, MD.  Over the past 7 years, Tanya Manning  has experienced recurrent episodes of hives.  She reports that over the past 4 years the hives have become "constant."  The hives have appeared at different times over her entire body.  The lesions are described as erythematous, raised, and pruritic.  Individual hives last less than 24 hours without leaving residual pigmentation or bruising. She has experienced associated angioedema of the lips, tongue, ears, and hands but denies concomitant cardiopulmonary or GI symptoms. She often times has a migraine like head ache in association. She has not experienced unexpected weight loss, recurrent fevers or drenching night sweats. No specific medication, food, skin care product, detergent, soap, or other environmental triggers have been identified. In the past she has fasted food for 1 week without benefit.  Tanya Manning has been able to suppress symptoms with 10 to 20 mg of cetirizine daily, however she discontinued the cetirizine over the past 3 days in anticipation of today's visit with possible skin testing.  After approximately 48 hours off of antihistamines she began to develop hives "all over" and today went to the emergency department for evaluation and treatment after developing significant swelling of the lips and face as well as a sensation of difficulty breathing.   Assessment and plan: Allergic urticaria Unclear etiology.  NSAIDs and emotional stress commonly exacerbate urticaria but are not the underlying etiology in this case. Physical urticarias are negative by history (i.e.  pressure-induced, temperature, vibration, solar, etc.). History and lesions are not consistent with urticaria pigmentosa so I am not suspicious for mastocytosis. We will rule out other potential etiologies with labs. For symptom relief, patient is to take oral antihistamines as directed.  Take the prednisone course to completion as prescribed by the emergency department  The following labs have been ordered: FCeRI antibody, anti-thyroglobulin antibody, thyroid peroxidase antibody, tryptase, C4 level, H. pylori serology, CBC, CMP, ESR, ANA, and alpha-gal panel.  These labs are not to be drawn until 2 weeks after the final dose of prednisone has been taken.  The patient will be called with further recommendations after lab results have returned.  If labs are unrevealing, the patient will return for allergy skin testing after having been off of antihistamines for 3 days.  Instructions have been discussed and provided for H1/H2 receptor blockade with titration to find lowest effective dose.  A prescription has been provided for levocetirizine (Xyzal), 5 mg daily as needed.  A prescription has been provided for famotidine (Pepcid), 65m twice daily as needed.  Should there be a significant increase or change in symptoms, a journal is to be kept recording any foods eaten, beverages consumed, medications taken within a 6 hour period prior to the onset of symptoms, as well as record activities being performed, and environmental conditions. For any symptoms concerning for anaphylaxis, epinephrine is to be administered and 911 is to be called immediately.  Angioedema Associated angioedema occurs in up to 50% of patients with chronic urticaria.  Treatment/diagnostic plan as outlined above.  Other forms of dyspnea Spirometry today reveals significant postbronchodilator improvement (720 mL, 24%).  A prescription has been provided for albuterol HFA, 1 to  2 inhalations every 4-6 hours if needed.  To  maximize pulmonary deposition, a spacer has been provided along with instructions for its proper administration with an HFA inhaler.  Subjective and objective measures of pulmonary function will be followed and the treatment plan will be adjusted accordingly.  Chronic rhinitis  A prescription has been provided for azelastine nasal spray, 1-2 sprays per nostril 2 times daily as needed. Proper nasal spray technique has been discussed and demonstrated.   Nasal saline lavage (NeilMed) has been recommended as needed and prior to medicated nasal sprays along with instructions for proper administration.  For thick post nasal drainage, add guaifenesin 1200 mg (Mucinex Maximum Strength)  twice daily as needed with adequate hydration as discussed.  We were unable to perform skin tests today due to recent administration of antihistamine.   The patient will be scheduled to return in the near future for allergy skin testing after having been off of antihistamines for at least 3 days.    To control symptoms while off of antihistamines, prednisone has been provided: 31m per day for 3 days.  Further recommendations will be made at that time based upon skin test results.   Meds ordered this encounter  Medications  . levocetirizine (XYZAL) 5 MG tablet    Sig: Take 1 tablet (5 mg total) by mouth every evening.    Dispense:  30 tablet    Refill:  5  . famotidine (PEPCID) 20 MG tablet    Sig: Take 1 tablet (20 mg total) by mouth 2 (two) times daily.    Dispense:  60 tablet    Refill:  5  . albuterol (PROAIR HFA) 108 (90 Base) MCG/ACT inhaler    Sig: Inhale 2 puffs into the lungs every 4 (four) hours as needed for wheezing or shortness of breath.    Dispense:  18 g    Refill:  1    Diagnostics: Spirometry: FVC was 3.18 L and FEV1 was 3.02 L (90% predicted) with significant (720 mL, 24%) postbronchodilator improvement.  Please see scanned spirometry results for details. Allergy skin testing: We were  unable to perform skin tests today due to recent administration of antihistamine.   Physical examination: Blood pressure 106/80, pulse (!) 104, resp. rate 16, height 5' 6"  (1.676 m), weight 169 lb (76.7 kg).  General: Alert, interactive, in no acute distress. HEENT: TMs pearly gray, turbinates moderately edematous without discharge, post-pharynx erythematous. Neck: Supple without lymphadenopathy. Lungs: Clear to auscultation without wheezing, rhonchi or rales. CV: Normal S1, S2 without murmurs. Abdomen: Nondistended, nontender. Skin: Warm and dry, without lesions or rashes. Extremities:  No clubbing, cyanosis or edema. Neuro:   Grossly intact.  Review of systems:  Review of systems negative except as noted in HPI / PMHx or noted below: Review of Systems  Constitutional: Negative.   HENT: Negative.   Eyes: Negative.   Respiratory: Negative.   Cardiovascular: Negative.   Gastrointestinal: Negative.   Genitourinary: Negative.   Musculoskeletal: Negative.   Skin: Negative.   Neurological: Negative.   Endo/Heme/Allergies: Negative.   Psychiatric/Behavioral: Negative.     Past medical history:  Past Medical History:  Diagnosis Date  . Urticaria     Past surgical history:  Past Surgical History:  Procedure Laterality Date  . CESAREAN SECTION    . NOSE SURGERY      Family history: Family History  Problem Relation Age of Onset  . Allergic rhinitis Brother   . Angioedema Neg Hx   . Asthma Neg  Hx   . Atopy Neg Hx   . Eczema Neg Hx   . Immunodeficiency Neg Hx   . Urticaria Neg Hx     Social history: Social History   Socioeconomic History  . Marital status: Legally Separated    Spouse name: Not on file  . Number of children: Not on file  . Years of education: Not on file  . Highest education level: Not on file  Occupational History  . Not on file  Tobacco Use  . Smoking status: Never Smoker  . Smokeless tobacco: Never Used  Vaping Use  . Vaping Use: Never  used  Substance and Sexual Activity  . Alcohol use: No  . Drug use: No  . Sexual activity: Not on file  Other Topics Concern  . Not on file  Social History Narrative  . Not on file   Social Determinants of Health   Financial Resource Strain:   . Difficulty of Paying Living Expenses:   Food Insecurity:   . Worried About Charity fundraiser in the Last Year:   . Arboriculturist in the Last Year:   Transportation Needs:   . Film/video editor (Medical):   Marland Kitchen Lack of Transportation (Non-Medical):   Physical Activity:   . Days of Exercise per Week:   . Minutes of Exercise per Session:   Stress:   . Feeling of Stress :   Social Connections:   . Frequency of Communication with Friends and Family:   . Frequency of Social Gatherings with Friends and Family:   . Attends Religious Services:   . Active Member of Clubs or Organizations:   . Attends Archivist Meetings:   Marland Kitchen Marital Status:   Intimate Partner Violence:   . Fear of Current or Ex-Partner:   . Emotionally Abused:   Marland Kitchen Physically Abused:   . Sexually Abused:     Environmental History: The patient lives in a 33 year old house with hardwood floors throughout and central air/heat.  There is mold/water damage in the home.  There are no dogs or cats in the home.  She is a non-smoker.  Current Outpatient Medications  Medication Sig Dispense Refill  . Cetirizine HCl (ZYRTEC PO) Take by mouth.    . EPINEPHrine 0.3 mg/0.3 mL IJ SOAJ injection Inject 0.3 mLs (0.3 mg total) into the muscle as needed for anaphylaxis. 2 each 1  . albuterol (PROAIR HFA) 108 (90 Base) MCG/ACT inhaler Inhale 2 puffs into the lungs every 4 (four) hours as needed for wheezing or shortness of breath. 18 g 1  . famotidine (PEPCID) 20 MG tablet Take 1 tablet (20 mg total) by mouth 2 (two) times daily. 60 tablet 5  . levocetirizine (XYZAL) 5 MG tablet Take 1 tablet (5 mg total) by mouth every evening. 30 tablet 5   No current  facility-administered medications for this visit.    Known medication allergies: No Known Allergies  I appreciate the opportunity to take part in Tanya Manning's care. Please do not hesitate to contact me with questions.  Sincerely,   R. Edgar Frisk, MD

## 2019-08-27 NOTE — ED Notes (Signed)
Patient states she's doing much better. Shows no sign of distress.

## 2019-09-16 ENCOUNTER — Ambulatory Visit: Payer: Self-pay | Admitting: Allergy & Immunology

## 2019-09-17 ENCOUNTER — Telehealth: Payer: Self-pay | Admitting: Allergy and Immunology

## 2019-09-17 NOTE — Telephone Encounter (Signed)
Pt. Received the wrong paperwork for her labs, requesting they be sent via email if possible due to [email or web address].

## 2019-09-17 NOTE — Telephone Encounter (Signed)
Lab orders have been scanned and emailed to pt, pt is aware of me sending them

## 2019-09-23 ENCOUNTER — Ambulatory Visit: Payer: Medicaid Other | Attending: Internal Medicine

## 2019-09-23 DIAGNOSIS — Z23 Encounter for immunization: Secondary | ICD-10-CM

## 2019-09-23 NOTE — Progress Notes (Signed)
   Covid-19 Vaccination Clinic  Name:  Matison Nuccio    MRN: [id number] DOB: 1985-07-08  09/23/2019  Ms. Glennis Borger was observed post Covid-19 immunization for 15 minutes without incident. She was provided with Vaccine Information Sheet and instruction to access the V-Safe system.   Ms. Devon Kingdon was instructed to call 911 with any severe reactions post vaccine: Marland Kitchen Difficulty breathing  . Swelling of face and throat  . A fast heartbeat  . A bad rash all over body  . Dizziness and weakness   Immunizations Administered    Name Date Dose VIS Date Route   Pfizer COVID-19 Vaccine 09/23/2019 10:14 AM 0.3 mL 04/02/2018 Intramuscular   Manufacturer: [employer], [employer]   Lot: [id number]   NDC: [address]-6

## 2019-09-24 LAB — COMPREHENSIVE METABOLIC PANEL
ALT: 27 IU/L (ref 0–32)
AST: 25 IU/L (ref 0–40)
Albumin/Globulin Ratio: 1.8 (ref 1.2–2.2)
Albumin: 4.6 g/dL (ref 3.8–4.8)
Alkaline Phosphatase: 87 IU/L (ref 48–121)
BUN/Creatinine Ratio: 16 (ref 9–23)
BUN: 11 mg/dL (ref 6–20)
Bilirubin Total: 0.5 mg/dL (ref 0.0–1.2)
CO2: 23 mmol/L (ref 20–29)
Calcium: 9.4 mg/dL (ref 8.7–10.2)
Chloride: 101 mmol/L (ref 96–106)
Creatinine, Ser: 0.67 mg/dL (ref 0.57–1.00)
GFR calc Af Amer: 134 mL/min/{1.73_m2} (ref >59)
GFR calc non Af Amer: 116 mL/min/{1.73_m2} (ref >59)
Globulin, Total: 2.6 g/dL (ref 1.5–4.5)
Glucose: 88 mg/dL (ref 65–99)
Potassium: 5 mmol/L (ref 3.5–5.2)
Sodium: 137 mmol/L (ref 134–144)
Total Protein: 7.2 g/dL (ref 6.0–8.5)

## 2019-09-24 LAB — ALPHA-GAL PANEL
Alpha Gal IgE*: <0.1 kU/L (ref <0.10)
Beef (Bos spp) IgE: <0.1 kU/L (ref <0.35)
Class Interpretation: 0
Class Interpretation: 0
Class Interpretation: 0
Lamb/Mutton (Ovis spp) IgE: <0.1 kU/L (ref <0.35)
Pork (Sus spp) IgE: <0.1 kU/L (ref <0.35)

## 2019-09-24 LAB — CBC
Hematocrit: 44.9 % (ref 34.0–46.6)
Hemoglobin: 14.7 g/dL (ref 11.1–15.9)
MCH: 28.5 pg (ref 26.6–33.0)
MCHC: 32.7 g/dL (ref 31.5–35.7)
MCV: 87 fL (ref 79–97)
Platelets: 260 10*3/uL (ref 150–450)
RBC: 5.15 x10E6/uL (ref 3.77–5.28)
RDW: 12.6 % (ref 11.7–15.4)
WBC: 7.3 10*3/uL (ref 3.4–10.8)

## 2019-09-24 LAB — TRYPTASE: Tryptase: 4.3 ug/L (ref 2.2–13.2)

## 2019-09-24 LAB — SEDIMENTATION RATE: Sed Rate: 8 mm/hr (ref 0–32)

## 2019-09-24 LAB — THYROGLOBULIN ANTIBODY: Thyroglobulin Antibody: <1 IU/mL (ref 0.0–0.9)

## 2019-09-24 LAB — H PYLORI, IGM, IGG, IGA AB
H pylori, IgM Abs: <9 units (ref 0.0–8.9)
H. pylori, IgA Abs: <9 units (ref 0.0–8.9)
H. pylori, IgG AbS: 2.26 Index Value — ABNORMAL HIGH (ref 0.00–0.79)

## 2019-09-24 LAB — THYROID PEROXIDASE ANTIBODY: Thyroperoxidase Ab SerPl-aCnc: 9 IU/mL (ref 0–34)

## 2019-09-24 LAB — ANA: Anti Nuclear Antibody (ANA): NEGATIVE

## 2019-09-24 LAB — CHRONIC URTICARIA: cu index: >50 — ABNORMAL HIGH (ref <10)

## 2019-09-24 LAB — C4 COMPLEMENT: Complement C4, Serum: 33 mg/dL (ref 12–38)

## 2019-09-29 ENCOUNTER — Other Ambulatory Visit: Payer: Self-pay

## 2019-09-29 MED ORDER — AZELASTINE HCL 0.1 % NA SOLN: NASAL | 5 refills | Status: AC

## 2019-10-02 NOTE — Patient Instructions (Addendum)
Urticaria/angioedema Continue Xyzal 5 mg 1 tablet twice a day to help with itching Continue famotidine 20 mg 1 tablet twice a day to help with itching If your symptoms re-occur, begin a journal of events that occurred for up to 6 hours before your symptoms began including foods and beverages consumed, soaps or perfumes you had contact with, and medications.   Other forms of dyspnea Start Flovent 110- using 2 puffs twice a day with spacer May use albuterol 2 puffs every 4 hours as needed for cough, wheeze, tightness in chest, or shortness of breath Get chest xray today  Chronic rhinitis Start fluticasone nasal spray 2 sprays each nostril once a day as needed for stuffy nose Continue azelastine 1-2 sprays each nostril twice a day as needed for runny nose For thick postnasal drainage, add guaifenesin 1200 mg (Mucinex maximum strength) twice daily as needed with adequate hydration as discussed. May use saline nasal rinse as needed for nasal symptoms. Use this prior to any medicated nasal sprays  Please let us know if this treatment plan is not working well for you Schedule follow up appointment in 4 weeks

## 2019-10-03 ENCOUNTER — Encounter: Payer: Self-pay | Admitting: Family

## 2019-10-03 ENCOUNTER — Other Ambulatory Visit: Payer: Self-pay

## 2019-10-03 ENCOUNTER — Ambulatory Visit (HOSPITAL_BASED_OUTPATIENT_CLINIC_OR_DEPARTMENT_OTHER)
Admission: RE | Admit: 2019-10-03 | Discharge: 2019-10-03 | Disposition: A | Discharge status: Home / Self Care | Payer: Medicaid Other | Source: Ambulatory Visit | Attending: Family | Admitting: Family

## 2019-10-03 ENCOUNTER — Ambulatory Visit (INDEPENDENT_AMBULATORY_CARE_PROVIDER_SITE_OTHER): Payer: Medicaid Other | Admitting: Family

## 2019-10-03 VITALS — BP 100/68 | HR 88 | Resp 18

## 2019-10-03 DIAGNOSIS — T783XXD Angioneurotic edema, subsequent encounter: Secondary | ICD-10-CM | POA: Diagnosis not present

## 2019-10-03 DIAGNOSIS — R0609 Other forms of dyspnea: Secondary | ICD-10-CM | POA: Diagnosis not present

## 2019-10-03 DIAGNOSIS — J310 Chronic rhinitis: Secondary | ICD-10-CM

## 2019-10-03 DIAGNOSIS — L500 Allergic urticaria: Secondary | ICD-10-CM | POA: Diagnosis not present

## 2019-10-03 MED ORDER — FLUTICASONE PROPIONATE 50 MCG/ACT NA SUSP: 2.0000 | Freq: Every day | NASAL | 5 refills | Status: AC

## 2019-10-03 MED ORDER — FLOVENT HFA 110 MCG/ACT IN AERO: 2.0000 | INHALATION_SPRAY | Freq: Two times a day (BID) | RESPIRATORY_TRACT | 5 refills | Status: DC

## 2019-10-03 NOTE — Progress Notes (Signed)
[address] [place] [address] Dept: [phone]  FOLLOW UP NOTE  Patient ID: Tanya Manning, female    DOB: April 07, 1985  Age: 34 y.o. MRN: [id number] Date of Office Visit: 10/03/2019  Assessment  Chief Complaint: Allergic Rhinitis  (throat feels like she can't clear, gasping for air just while sitting)  HPI Tanya Manning is a 34 year old female who presents today for an acute visit.  She was last seen on August 27, 2019 by Dr. Nunzio Cobbs for allergic urticaria, angioedema, other forms of dyspnea, and chronic rhinitis.  Chronic rhinitis is reported as not well controlled.  She is here today because she feels like she is making a lot of phlegm in her throat and having a lot of nasal congestion.  She recently started the azelastine nasal spray just 2 days ago and is using saline nasal spray.  She has not tried the guaifenesin to see if this helps with the drainage down her throat.  She was unable to get skin tested at her last office visit due to being on antihistamines.  Her dyspnea is reported as not well controlled. It is no worse since her last office visit.  She reports that this dyspnea has been going on for at least 2 years, but has gotten worse since she went to the ER back in July.  She feels like she has a hard time getting a breath in and this causes her to feel anxious.  She also reports shortness of breath and dry cough.  She is unsure if she feels tightness in her chest but does not report any wheezing.  She thinks that the albuterol that she was given at her last office visit has helped some, but she still gasps for air.  She is using her albuterol at least 2-3 times a day and she feels like wearing a mask makes her breathing worse.  She has never had a chest x-ray.  Urticaria is reported as moderately controlled with Pepcid 20 mg 1 tablet twice a day and Xyzal 5 mg 1 tablet twice a day.  She reports that she is still having hives, but not as bad.  Current  medications are as listed in the chart.       Drug Allergies:  No Known Allergies  Review of Systems: Review of Systems  Constitutional: Negative for chills and fever.  HENT: Positive for congestion.        Reports post nasal drip  Eyes:       Denies itchy watery eyes  Respiratory: Positive for cough and shortness of breath. Negative for wheezing.   Cardiovascular: Negative for chest pain and palpitations.  Gastrointestinal: Negative for heartburn.  Genitourinary: Negative for dysuria.  Skin: Positive for itching and rash.  Neurological: Positive for headaches.     Physical Exam: BP 100/68 (BP Location: Right Arm, Patient Position: Sitting, Cuff Size: Normal)   Pulse 88   Resp 18   SpO2 98%    Physical Exam Constitutional:      Appearance: Normal appearance.  HENT:     Head: Normocephalic and atraumatic.     Right Ear: Tympanic membrane, ear canal and external ear normal.     Left Ear: Tympanic membrane, ear canal and external ear normal.     Nose: Nose normal.     Mouth/Throat:     Mouth: Mucous membranes are moist.     Pharynx: Oropharynx is clear.     Comments: No cervical lymph nodes palpable Cardiovascular:  Rate and Rhythm: Regular rhythm.     Heart sounds: Normal heart sounds.  Pulmonary:     Effort: Pulmonary effort is normal.     Breath sounds: Normal breath sounds.     Comments: Lungs clear to auscultation Musculoskeletal:     Cervical back: Neck supple.  Skin:    General: Skin is warm.     Comments: Small urticarial lesions noted on bilateral arms  Neurological:     Mental Status: She is alert and oriented to person, place, and time.  Psychiatric:        Mood and Affect: Mood normal.        Behavior: Behavior normal.        Thought Content: Thought content normal.        Judgment: Judgment normal.     Diagnostics: FVC 4.22 L, FEV1 3.55 L.  Predicted FVC 4.08 L, FEV1 3.45 L.  Spirometry indicates normal ventilatory function.  Status post  bronchodilator response shows FVC 4.27 L, FEV1 3.60 L.  Spirometry indicates normal ventilatory function with no significant bronchodilator response.  Assessment and Plan: 1. Other forms of dyspnea   2. Chronic rhinitis   3. Allergic urticaria   4. Angioedema, subsequent encounter     Meds ordered this encounter  Medications  . fluticasone (FLOVENT HFA) 110 MCG/ACT inhaler    Sig: Inhale 2 puffs into the lungs 2 (two) times daily. Use with spacer and rinse mouth out after.    Dispense:  1 each    Refill:  5  . fluticasone (FLONASE) 50 MCG/ACT nasal spray    Sig: Place 2 sprays into both nostrils daily.    Dispense:  16 g    Refill:  5    Patient Instructions  Urticaria/angioedema Continue Xyzal 5 mg 1 tablet twice a day to help with itching Continue famotidine 20 mg 1 tablet twice a day to help with itching If your symptoms re-occur, begin a journal of events that occurred for up to 6 hours before your symptoms began including foods and beverages consumed, soaps or perfumes you had contact with, and medications.   Other forms of dyspnea Start Flovent 110- using 2 puffs twice a day with spacer May use albuterol 2 puffs every 4 hours as needed for cough, wheeze, tightness in chest, or shortness of breath Get chest xray today  Chronic rhinitis Start fluticasone nasal spray 2 sprays each nostril once a day as needed for stuffy nose Continue azelastine 1-2 sprays each nostril twice a day as needed for runny nose For thick postnasal drainage, add guaifenesin 1200 mg (Mucinex maximum strength) twice daily as needed with adequate hydration as discussed. May use saline nasal rinse as needed for nasal symptoms. Use this prior to any medicated nasal sprays  Please let us know if this treatment plan is not working well for you Schedule follow up appointment in 4 weeks   Return in about 4 weeks (around 10/31/2019), or if symptoms worsen or fail to improve.    Thank you for the  opportunity to care for this patient.  Please do not hesitate to contact me with questions.  Nehemiah Settle, FNP [facility]

## 2019-10-03 NOTE — Progress Notes (Signed)
Please call the patient and let her know that her chest xray is normal

## 2019-10-14 ENCOUNTER — Ambulatory Visit: Payer: Medicaid Other

## 2019-12-10 ENCOUNTER — Encounter: Payer: Self-pay | Admitting: Allergy and Immunology

## 2019-12-10 ENCOUNTER — Ambulatory Visit (INDEPENDENT_AMBULATORY_CARE_PROVIDER_SITE_OTHER): Payer: Medicaid Other | Admitting: Allergy and Immunology

## 2019-12-10 ENCOUNTER — Other Ambulatory Visit: Payer: Self-pay

## 2019-12-10 DIAGNOSIS — L501 Idiopathic urticaria: Secondary | ICD-10-CM

## 2019-12-10 DIAGNOSIS — L2089 Other atopic dermatitis: Secondary | ICD-10-CM | POA: Diagnosis not present

## 2019-12-10 DIAGNOSIS — L209 Atopic dermatitis, unspecified: Secondary | ICD-10-CM

## 2019-12-10 DIAGNOSIS — L508 Other urticaria: Secondary | ICD-10-CM

## 2019-12-10 HISTORY — DX: Atopic dermatitis, unspecified: L20.9

## 2019-12-10 MED ORDER — TRIAMCINOLONE ACETONIDE 0.1 % EX OINT: TOPICAL_OINTMENT | CUTANEOUS | 3 refills | Status: DC

## 2019-12-10 MED ORDER — OMALIZUMAB 150 MG ~~LOC~~ SOLR
150.0000 mg | SUBCUTANEOUS | Status: DC
  Administered 2019-12-10: 150 mg via SUBCUTANEOUS

## 2019-12-10 NOTE — Assessment & Plan Note (Signed)
   We will proceed with a therapeutic trial of Xolair.  A sample injection will be provided today.  If the patient notices significant symptom relief, we will submit paperwork to insurance.  Continue H1/H2 receptor blockade, titrating to the lowest effective dose necessary to suppress urticaria.  To avoid diminishing benefit with daily use (tachyphylaxis) of second generation antihistamine, consider alternating every few months between fexofenadine (Allegra) and levocetirizine (Xyzal).

## 2019-12-10 NOTE — Patient Instructions (Addendum)
Chronic urticaria  We will proceed with a therapeutic trial of Xolair.  A sample injection will be provided today.  If the patient notices significant symptom relief, we will submit paperwork to insurance.  Continue H1/H2 receptor blockade, titrating to the lowest effective dose necessary to suppress urticaria.  To avoid diminishing benefit with daily use (tachyphylaxis) of second generation antihistamine, consider alternating every few months between fexofenadine (Allegra) and levocetirizine (Xyzal).  Atopic eczema  I have recommended Aquaphor to moisturize dry patches.  A prescription has been provided for triamcinolone 0.1% ointment to sparingly affected areas twice daily as needed.  This medication is not to be used on the face, neck, axillae, or groin.   Return in about 6 months (around 06/08/2020), or if symptoms worsen or fail to improve.

## 2019-12-10 NOTE — Progress Notes (Signed)
Follow-up Note  RE: Tanya Manning MRN: [id number] DOB: 03/26/1985 Date of Office Visit: 12/10/2019  Primary care provider: Caffie Damme, MD Referring provider: Caffie Damme, MD  History of present illness: Tanya Manning is a 34 y.o. female with chronic urticaria presenting today for follow-up.  She was last seen in this clinic on October 03, 2019 by Tanya Settle, FNP.  She reports that despite compliance with levocetirizine 5 mg twice a day and Pepcid 20 mg twice a day, she still experiences breakthrough hives.  She does not experience concomitant cardiopulmonary or GI symptoms.  In addition to the hives, she has been developing dry, erythematous, pruritic patches on her upper and lower extremities.  She is uncertain if this new eczematous rash is related to the urticaria.  Assessment and plan: Chronic urticaria  We will proceed with a therapeutic trial of Xolair.  A sample injection will be provided today.  If the patient notices significant symptom relief, we will submit paperwork to insurance.  Continue H1/H2 receptor blockade, titrating to the lowest effective dose necessary to suppress urticaria.  To avoid diminishing benefit with daily use (tachyphylaxis) of second generation antihistamine, consider alternating every few months between fexofenadine (Allegra) and levocetirizine (Xyzal).  Atopic eczema  I have recommended Aquaphor to moisturize dry patches.  A prescription has been provided for triamcinolone 0.1% ointment to sparingly affected areas twice daily as needed.  This medication is not to be used on the face, neck, axillae, or groin.   Physical examination: Blood pressure 108/70, pulse 96, temperature 99.2 F (37.3 C), temperature source Tympanic, resp. rate 16, SpO2 100 %.  General: Alert, interactive, in no acute distress. Neck: Supple without lymphadenopathy. Lungs: Clear to auscultation without wheezing, rhonchi or rales. CV: Normal S1, S2  without murmurs. Skin: Dry, erythematous patches on the upper and lower extremities as well as several scattered uritcarial lesions.  The following portions of the patient's history were reviewed and updated as appropriate: allergies, current medications, past family history, past medical history, past social history, past surgical history and problem list.  Current Outpatient Medications  Medication Sig Dispense Refill  . azelastine (ASTELIN) 0.1 % nasal spray 1-2 sorays per nostril bid as needed 30 mL 5  . EPINEPHrine 0.3 mg/0.3 mL IJ SOAJ injection Inject 0.3 mLs (0.3 mg total) into the muscle as needed for anaphylaxis. 2 each 1  . famotidine (PEPCID) 20 MG tablet Take 1 tablet (20 mg total) by mouth 2 (two) times daily. 60 tablet 5  . fluticasone (FLONASE) 50 MCG/ACT nasal spray Place 2 sprays into both nostrils daily. 16 g 5  . levocetirizine (XYZAL) 5 MG tablet Take 1 tablet (5 mg total) by mouth every evening. 30 tablet 5  . albuterol (PROAIR HFA) 108 (90 Base) MCG/ACT inhaler Inhale 2 puffs into the lungs every 4 (four) hours as needed for wheezing or shortness of breath. (Patient not taking: Reported on 12/10/2019) 18 g 1  . Cetirizine HCl (ZYRTEC PO) Take by mouth. (Patient not taking: Reported on 12/10/2019)    . fluticasone (FLOVENT HFA) 110 MCG/ACT inhaler Inhale 2 puffs into the lungs 2 (two) times daily. Use with spacer and rinse mouth out after. (Patient not taking: Reported on 12/10/2019) 1 each 5  . triamcinolone ointment (KENALOG) 0.1 % Apply twice daily as needed to red itchy areas below the face. 45 g 3   Current Facility-Administered Medications  Medication Dose Route Frequency Provider Last Rate Last Admin  . omalizumab Geoffry Paradise) injection  150 mg  150 mg Subcutaneous Q14 Days Tanya Manning, Heywood Iles, MD   150 mg at 12/10/19 1131    No Known Allergies  I appreciate the opportunity to take part in [name]'s care. Please do not hesitate to contact me with  questions.  Sincerely,   R. Jorene Guest, MD

## 2019-12-10 NOTE — Assessment & Plan Note (Signed)
   I have recommended Aquaphor to moisturize dry patches.  A prescription has been provided for triamcinolone 0.1% ointment to sparingly affected areas twice daily as needed.  This medication is not to be used on the face, neck, axillae, or groin.

## 2019-12-15 ENCOUNTER — Telehealth: Payer: Self-pay

## 2019-12-15 NOTE — Telephone Encounter (Signed)
Good afternoon,  I hope that you had a wonderful day weekend. I have noticed improvements with the Xolair shoot sample. I can breath much better, my nose is not as stuffy and my hives reduced. I would like to continue with the shots if possible. I left a singed copy to see if the insurance will cover it. Thank you so much again! Have a wonderful week ahead.  Kind regards, Tanya Manning   Please advise to moving forward with xolair thank you

## 2019-12-15 NOTE — Telephone Encounter (Signed)
Called patient and advised approval and submit for Xolair to [facility] and will reach out once delivery set to make next appt for Xolair injections

## 2019-12-17 MED ORDER — OMALIZUMAB 150 MG ~~LOC~~ SOLR
300.0000 mg | SUBCUTANEOUS | Status: DC
  Administered 2019-12-11 – 2020-08-23 (×9): 300 mg via SUBCUTANEOUS

## 2019-12-17 NOTE — Addendum Note (Signed)
Addended by: Devoria Glassing on: 12/17/2019 10:32 AM   Modules accepted: Orders

## 2019-12-31 ENCOUNTER — Ambulatory Visit: Payer: Self-pay

## 2020-01-09 ENCOUNTER — Ambulatory Visit (INDEPENDENT_AMBULATORY_CARE_PROVIDER_SITE_OTHER): Payer: Medicaid Other

## 2020-01-09 ENCOUNTER — Other Ambulatory Visit: Payer: Self-pay

## 2020-01-09 DIAGNOSIS — L501 Idiopathic urticaria: Secondary | ICD-10-CM

## 2020-01-09 DIAGNOSIS — L508 Other urticaria: Secondary | ICD-10-CM

## 2020-01-18 ENCOUNTER — Encounter (HOSPITAL_COMMUNITY): Payer: Self-pay | Admitting: *Deleted

## 2020-01-18 ENCOUNTER — Other Ambulatory Visit: Payer: Self-pay

## 2020-01-18 ENCOUNTER — Emergency Department (HOSPITAL_COMMUNITY)
Admission: EM | Admit: 2020-01-18 | Discharge: 2020-01-18 | Disposition: A | Discharge status: Home / Self Care | Payer: Medicaid Other | Attending: Emergency Medicine | Admitting: Emergency Medicine

## 2020-01-18 DIAGNOSIS — T7840XA Allergy, unspecified, initial encounter: Secondary | ICD-10-CM | POA: Diagnosis not present

## 2020-01-18 DIAGNOSIS — L509 Urticaria, unspecified: Secondary | ICD-10-CM | POA: Diagnosis not present

## 2020-01-18 LAB — I-STAT BETA HCG BLOOD, ED (MC, WL, AP ONLY): I-stat hCG, quantitative: <5 m[IU]/mL (ref <5)

## 2020-01-18 MED ORDER — PREDNISONE 10 MG PO TABS: 20.0000 mg | ORAL_TABLET | Freq: Every day | ORAL | 0 refills | Status: AC

## 2020-01-18 MED ORDER — FAMOTIDINE IN NACL 20-0.9 MG/50ML-% IV SOLN
20.0000 mg | Freq: Once | INTRAVENOUS | Status: AC
Start: 2020-01-18 — End: 2020-01-18
  Administered 2020-01-18: 19:00:00 20 mg via INTRAVENOUS
  Filled 2020-01-18: qty 50

## 2020-01-18 MED ORDER — METHYLPREDNISOLONE SODIUM SUCC 125 MG IJ SOLR
125.0000 mg | Freq: Once | INTRAMUSCULAR | Status: AC
Start: 2020-01-18 — End: 2020-01-18
  Administered 2020-01-18: 18:00:00 125 mg via INTRAVENOUS
  Filled 2020-01-18: qty 2

## 2020-01-18 MED ORDER — EPINEPHRINE 0.3 MG/0.3ML IJ SOAJ: 0.3000 mg | INTRAMUSCULAR | 0 refills | Status: DC | PRN

## 2020-01-18 MED ORDER — DIPHENHYDRAMINE HCL 50 MG/ML IJ SOLN
25.0000 mg | Freq: Once | INTRAMUSCULAR | Status: AC
Start: 2020-01-18 — End: 2020-01-18
  Administered 2020-01-18: 18:00:00 25 mg via INTRAVENOUS
  Filled 2020-01-18: qty 1

## 2020-01-18 NOTE — ED Notes (Signed)
Pt is able to speak in full sentences, pt has non labored breathing. Pt is able to hold secretions.

## 2020-01-18 NOTE — ED Notes (Signed)
The pt reports that she is dizzy  She appears very anxious  And is hyperventilating

## 2020-01-18 NOTE — Discharge Instructions (Signed)
Please pick up medication and take as prescribed. I have re-prescribed an epi pen for you as well.  Follow up with your allergist regarding your ED visit. They may need to switch your medications up.   Return to the ED for any worsening symptoms

## 2020-01-18 NOTE — ED Triage Notes (Signed)
The pt reports that she has an autoimmune disorder and since yesterday she has had a sorethroat and feels like she cannot get her breathe well.  She reports that she took an epi pen yesterday and felt ok .  She works here  And withing the past hour has begun to feel worse.  Alert no distress at present  She also reports that she feels dizzy

## 2020-01-18 NOTE — ED Provider Notes (Signed)
[facility] EMERGENCY DEPARTMENT Provider Note   CSN: [id number] Arrival date & time: 01/18/20  1459     History Chief Complaint  Patient presents with  . Allergic Reaction    Tanya Manning is a 34 y.o. female with PMHx chronic urticaria and angioedema who presents to the ED today with complaint of allergic reaction.  Patient reports she was at work yesterday when she began feeling like her throat was closing up.  She went home and gave her self her EpiPen however did not decide to come to the ED afterwards.  She states she feels mildly improved and went to work this morning however began having similar symptoms.  She also complains of some urticaria and difficulty breathing.  She was sent from work for further evaluation.  She states that she last saw her allergist on 1203 when she received her Xolair injection.  She is not scheduled to see him for another month.  She states that she typically will have an allergic reaction whenever she stops taking her tach and Pepcid however she has not missed any doses.  Last took them this morning.  She denies any new medicines or hygiene products.  She does report that she recently started drinking Slim fast shakes about 1 week ago however this is the only new thing that she has eaten.   The history is provided by the patient and medical records.       Past Medical History:  Diagnosis Date  . Atopic eczema 12/10/2019  . Urticaria     Patient Active Problem List   Diagnosis Date Noted  . Atopic eczema 12/10/2019  . Chronic urticaria 08/27/2019  . Allergic reaction 08/27/2019  . Angioedema 08/27/2019  . Other forms of dyspnea 08/27/2019  . Chronic rhinitis 08/27/2019    Past Surgical History:  Procedure Laterality Date  . CESAREAN SECTION    . NOSE SURGERY       OB History   No obstetric history on file.     Family History  Problem Relation Age of Onset  . Allergic rhinitis Brother   . Angioedema Neg  Hx   . Asthma Neg Hx   . Atopy Neg Hx   . Eczema Neg Hx   . Immunodeficiency Neg Hx   . Urticaria Neg Hx     Social History   Tobacco Use  . Smoking status: Never Smoker  . Smokeless tobacco: Never Used  Vaping Use  . Vaping Use: Never used  Substance Use Topics  . Alcohol use: No  . Drug use: No    Home Medications Prior to Admission medications   Medication Sig Start Date End Date Taking? Authorizing Provider  albuterol (PROAIR HFA) 108 (90 Base) MCG/ACT inhaler Inhale 2 puffs into the lungs every 4 (four) hours as needed for wheezing or shortness of breath. 08/27/19  Yes Bobbitt, Heywood Iles, MD  azelastine (ASTELIN) 0.1 % nasal spray 1-2 sorays per nostril bid as needed 09/29/19  Yes Bobbitt, Heywood Iles, MD  Cetirizine HCl (ZYRTEC PO) Take 1 tablet by mouth in the morning and at bedtime.   Yes [provider]  EPINEPHrine 0.3 mg/0.3 mL IJ SOAJ injection Inject 0.3 mLs (0.3 mg total) into the muscle as needed for anaphylaxis. 08/27/19  Yes Mesner, Barbara Cower, MD  famotidine (PEPCID) 20 MG tablet Take 1 tablet (20 mg total) by mouth 2 (two) times daily. 08/27/19  Yes Bobbitt, Heywood Iles, MD  fluticasone [facility]) 50 MCG/ACT nasal spray Place  2 sprays into both nostrils daily. 10/03/19  Yes Nehemiah Settle, FNP  triamcinolone ointment (KENALOG) 0.1 % Apply twice daily as needed to red itchy areas below the face. 12/10/19  Yes Bobbitt, Heywood Iles, MD  EPINEPHrine 0.3 mg/0.3 mL IJ SOAJ injection Inject 0.3 mg into the muscle as needed for anaphylaxis. 01/18/20   Sanita Estrada, PA-C  fluticasone (FLOVENT HFA) 110 MCG/ACT inhaler Inhale 2 puffs into the lungs 2 (two) times daily. Use with spacer and rinse mouth out after. Patient not taking: No sig reported 10/03/19   Nehemiah Settle, FNP  levocetirizine (XYZAL) 5 MG tablet Take 1 tablet (5 mg total) by mouth every evening. Patient not taking: Reported on 01/18/2020 08/27/19   Bobbitt, Heywood Iles, MD  predniSONE (DELTASONE) 10  MG tablet Take 2 tablets (20 mg total) by mouth daily for 5 days. 01/18/20 01/23/20  Tanda Rockers, PA-C    Allergies    Patient has no known allergies.  Review of Systems   Review of Systems  Constitutional: Negative for chills and fever.  Respiratory: Positive for chest tightness and shortness of breath.   Skin: Positive for rash.  All other systems reviewed and are negative.   Physical Exam Updated Vital Signs BP 118/82   Pulse 75   Temp 98.1 F (36.7 C) (Oral)   Resp 13   Ht 5\' 6"  (1.676 m)   Wt 76.7 kg   SpO2 100%   BMI 27.29 kg/m   Physical Exam Vitals and nursing note reviewed.  Constitutional:      Appearance: She is not ill-appearing or diaphoretic.  HENT:     Head: Normocephalic and atraumatic.     Mouth/Throat:     Mouth: Mucous membranes are moist.     Pharynx: No oropharyngeal exudate or posterior oropharyngeal erythema.     Comments: Uvula is midline and airway is patent. Phonating normally.  Eyes:     Conjunctiva/sclera: Conjunctivae normal.  Cardiovascular:     Rate and Rhythm: Normal rate and regular rhythm.  Pulmonary:     Effort: Pulmonary effort is normal.     Breath sounds: Normal breath sounds. No wheezing, rhonchi or rales.  Abdominal:     Palpations: Abdomen is soft.     Tenderness: There is no abdominal tenderness. There is no guarding or rebound.  Musculoskeletal:     Cervical back: Neck supple.  Skin:    General: Skin is warm and dry.     Comments: No obvious rash appreciated  Neurological:     Mental Status: She is alert.     ED Results / Procedures / Treatments   Labs (all labs ordered are listed, but only abnormal results are displayed) Labs Reviewed  I-STAT BETA HCG BLOOD, ED ([facility], [facility], [facility] ONLY)    EKG None  Radiology No results found.  Procedures Procedures (including critical care time)  Medications Ordered in ED Medications  famotidine (PEPCID) IVPB 20 mg premix (20 mg Intravenous New Bag/Given 01/18/20 1848)   diphenhydrAMINE (BENADRYL) injection 25 mg (25 mg Intravenous Given 01/18/20 1746)  methylPREDNISolone sodium succinate (SOLU-MEDROL) 125 mg/2 mL injection 125 mg (125 mg Intravenous Given 01/18/20 1746)    ED Course  I have reviewed the triage vital signs and the nursing notes.  Pertinent labs & imaging results that were available during my care of the patient were reviewed by me and considered in my medical decision making (see chart for details).    MDM Rules/Calculators/A&P  34 year old female with history of chronic urticaria and angioedema who presents to the ED today complaining of an allergic reaction including feeling like her throat is swelling with difficulty breathing the urticaria.  Reports this happens from time to time however only typically happens if she has missed any of her Zyrtec or Pepcid which she has not done recently.  Patient is followed by an allergist receives Xolair injections monthly with her most recent dose being 12/03.  Patient did give herself an EpiPen last night due to feeling like her throat was swelling however did not decide to come to the ED at that time.  Was sent from work for further evaluation today after she began having similar symptoms.  On arrival to the ED vitals are stable.  Patient is afebrile, nontachycardic.  She is mildly tachypneic on arrival at 25 however this seems to have dissipated while she is in the room.  There is no obvious urticarial rash appreciated.  No obvious signs of angioedema.  Her uvula is midline without any signs of oropharyngeal edema.  Her airway is patent.  She is phonating normally.  No wheezes on lung exam.  Given patient's complaints with her history of chronic allergies will provide Solu-Medrol, Pepcid, Benadryl in the ED today.  Do not feel patient requires epi at this time given very mild symptoms.  Pt reevaluated after receiving medications; reports she is feeling improved. Discussed  discharging her home with a few days of prednisone however she states that it makes her gain weight and gives her a headache. I will prescribe it for patient and she can decide whether or not she wants to take it. Will also represcribe epi pen for her. Pt instructed to follow up with her allergist regarding ED visit today. She is in agreement with plan and stable for discharge home.   This note was prepared using Dragon voice recognition software and may include unintentional dictation errors due to the inherent limitations of voice recognition software.  Final Clinical Impression(s) / ED Diagnoses Final diagnoses:  Allergic reaction, initial encounter    Rx / DC Orders ED Discharge Orders         Ordered    EPINEPHrine 0.3 mg/0.3 mL IJ SOAJ injection  As needed        01/18/20 1909    predniSONE (DELTASONE) 10 MG tablet  Daily        01/18/20 1909           Discharge Instructions     Please pick up medication and take as prescribed. I have re-prescribed an epi pen for you as well.  Follow up with your allergist regarding your ED visit. They may need to switch your medications up.   Return to the ED for any worsening symptoms       Tanda Rockers, Cordelia Poche 01/18/20 1912    Milagros Loll, MD 01/20/20 [phone]

## 2020-02-09 ENCOUNTER — Ambulatory Visit: Payer: Self-pay

## 2020-03-08 ENCOUNTER — Other Ambulatory Visit: Payer: Self-pay

## 2020-03-08 ENCOUNTER — Ambulatory Visit (INDEPENDENT_AMBULATORY_CARE_PROVIDER_SITE_OTHER): Payer: Medicaid Other

## 2020-03-08 DIAGNOSIS — L501 Idiopathic urticaria: Secondary | ICD-10-CM

## 2020-03-08 DIAGNOSIS — L508 Other urticaria: Secondary | ICD-10-CM

## 2020-03-30 ENCOUNTER — Telehealth: Payer: Self-pay | Admitting: Family Medicine

## 2020-03-30 NOTE — Telephone Encounter (Signed)
Pt asks for advice on what to take, she has used zyrtec and xyzal and neither seem to be effective anymore.

## 2020-04-05 ENCOUNTER — Ambulatory Visit: Payer: Self-pay

## 2020-04-06 ENCOUNTER — Ambulatory Visit (INDEPENDENT_AMBULATORY_CARE_PROVIDER_SITE_OTHER): Payer: Medicaid Other

## 2020-04-06 ENCOUNTER — Other Ambulatory Visit: Payer: Self-pay

## 2020-04-06 DIAGNOSIS — L501 Idiopathic urticaria: Secondary | ICD-10-CM

## 2020-04-06 NOTE — Telephone Encounter (Signed)
Please advise to change in antihistamines thank you

## 2020-04-06 NOTE — Telephone Encounter (Signed)
Patient is in office today wanting something else for her hives. She is currently taking Xyzal twice a day. Is helping some but is still breaking out. She has also tried Zyrtec unsuccessfully. Please advise

## 2020-04-07 NOTE — Telephone Encounter (Signed)
Spoke with pt she had some prednisone left on hand from the last ed visit so she took it. She is doing xyzal bid and pepcid bid and not getting any relief I mentioned trying to switch to allegra and seeing if that helps she is willing to try that please advise so nothing in helping her currently.

## 2020-04-07 NOTE — Telephone Encounter (Signed)
Lets please give her a day pack for some immediate relief. Begin prednisone 10 mg tablets. Take 2 tablets twice a day for 3 days, then take 2 tablets once a day for 1 day, then take 1 tablet on the 5th day, then stop. Please have her rotate the antihistamine to Allegra. Thank you

## 2020-04-07 NOTE — Telephone Encounter (Signed)
Please have him follow the H1H2 ladder as follows. Levocetirizine (Xyzal) 5 mg twice a day and famotidine (Pepcid) 20 mg twice a day. If no symptoms for 7-14 days then decrease to... Levocetirizine (Xyzal) 5 mg twice a day and famotidine (Pepcid) 20 mg once a day.  If no symptoms for 7-14 days then decrease to... Levocetirizine (Xyzal) 5 mg twice a day.  If no symptoms for 7-14 days then decrease to... Levocetirizine (Xyzal) 5 mg once a day.  He will need to change the antihistamine once every 1-2 months. Remember to rotate to a different antihistamine about every 1-2. Some examples of over the counter antihistamines include Zyrtec (cetirizine), Xyzal (levocetirizine), Allegra (fexofenadine), and Claritin (loratidine). He should continue with famotidine twice a day and Xolair injections. Thank you

## 2020-04-07 NOTE — Telephone Encounter (Signed)
How often is she getting her Xolair injections? When she gets them once a month are her hives more controled?

## 2020-04-07 NOTE — Telephone Encounter (Signed)
Lm for pt to call us back  

## 2020-04-07 NOTE — Telephone Encounter (Signed)
Q 4 weeks last injection was yesterday

## 2020-04-08 ENCOUNTER — Other Ambulatory Visit: Payer: Self-pay

## 2020-04-08 MED ORDER — PREDNISONE 10 MG PO TABS: ORAL_TABLET | ORAL | 0 refills | Status: DC

## 2020-04-08 NOTE — Telephone Encounter (Signed)
Pt informed of this, sent in prednisone taper for her and she will give [place] an update soon

## 2020-04-15 ENCOUNTER — Other Ambulatory Visit: Payer: Self-pay

## 2020-04-15 ENCOUNTER — Encounter (HOSPITAL_BASED_OUTPATIENT_CLINIC_OR_DEPARTMENT_OTHER): Payer: Self-pay

## 2020-04-15 ENCOUNTER — Emergency Department (HOSPITAL_BASED_OUTPATIENT_CLINIC_OR_DEPARTMENT_OTHER)
Admission: EM | Admit: 2020-04-15 | Discharge: 2020-04-15 | Disposition: A | Discharge status: Home / Self Care | Payer: Medicaid Other | Attending: Emergency Medicine | Admitting: Emergency Medicine

## 2020-04-15 DIAGNOSIS — T7840XA Allergy, unspecified, initial encounter: Secondary | ICD-10-CM

## 2020-04-15 DIAGNOSIS — L509 Urticaria, unspecified: Secondary | ICD-10-CM | POA: Diagnosis not present

## 2020-04-15 DIAGNOSIS — R21 Rash and other nonspecific skin eruption: Secondary | ICD-10-CM | POA: Diagnosis present

## 2020-04-15 LAB — COMPREHENSIVE METABOLIC PANEL
ALT: 26 U/L (ref 0–44)
AST: 23 U/L (ref 15–41)
Albumin: 4.2 g/dL (ref 3.5–5.0)
Alkaline Phosphatase: 66 U/L (ref 38–126)
Anion gap: 10 (ref 5–15)
BUN: 16 mg/dL (ref 6–20)
CO2: 21 mmol/L — ABNORMAL LOW (ref 22–32)
Calcium: 8.9 mg/dL (ref 8.9–10.3)
Chloride: 106 mmol/L (ref 98–111)
Creatinine, Ser: 0.84 mg/dL (ref 0.44–1.00)
GFR, Estimated: >60 mL/min (ref >60)
Glucose, Bld: 96 mg/dL (ref 70–99)
Potassium: 3.7 mmol/L (ref 3.5–5.1)
Sodium: 137 mmol/L (ref 135–145)
Total Bilirubin: 0.5 mg/dL (ref 0.3–1.2)
Total Protein: 7.1 g/dL (ref 6.5–8.1)

## 2020-04-15 LAB — CBC WITH DIFFERENTIAL/PLATELET
Abs Immature Granulocytes: 0.07 10*3/uL (ref 0.00–0.07)
Basophils Absolute: 0 10*3/uL (ref 0.0–0.1)
Basophils Relative: 0 %
Eosinophils Absolute: 0.2 10*3/uL (ref 0.0–0.5)
Eosinophils Relative: 1 %
HCT: 37.3 % (ref 36.0–46.0)
Hemoglobin: 12.9 g/dL (ref 12.0–15.0)
Immature Granulocytes: 1 %
Lymphocytes Relative: 30 %
Lymphs Abs: 4 10*3/uL (ref 0.7–4.0)
MCH: 28.5 pg (ref 26.0–34.0)
MCHC: 34.6 g/dL (ref 30.0–36.0)
MCV: 82.5 fL (ref 80.0–100.0)
Monocytes Absolute: 1.1 10*3/uL — ABNORMAL HIGH (ref 0.1–1.0)
Monocytes Relative: 8 %
Neutro Abs: 7.9 10*3/uL — ABNORMAL HIGH (ref 1.7–7.7)
Neutrophils Relative %: 60 %
Platelets: 246 10*3/uL (ref 150–400)
RBC: 4.52 MIL/uL (ref 3.87–5.11)
RDW: 13.4 % (ref 11.5–15.5)
WBC: 13.3 10*3/uL — ABNORMAL HIGH (ref 4.0–10.5)
nRBC: 0 % (ref 0.0–0.2)

## 2020-04-15 MED ORDER — FAMOTIDINE IN NACL 20-0.9 MG/50ML-% IV SOLN
20.0000 mg | Freq: Once | INTRAVENOUS | Status: AC
  Administered 2020-04-15: 20 mg via INTRAVENOUS
  Filled 2020-04-15: qty 50

## 2020-04-15 MED ORDER — DIPHENHYDRAMINE HCL 50 MG/ML IJ SOLN
50.0000 mg | Freq: Once | INTRAMUSCULAR | Status: AC
  Administered 2020-04-15: 50 mg via INTRAVENOUS
  Filled 2020-04-15: qty 1

## 2020-04-15 MED ORDER — METHYLPREDNISOLONE SODIUM SUCC 125 MG IJ SOLR
125.0000 mg | Freq: Once | INTRAMUSCULAR | Status: AC
  Administered 2020-04-15: 125 mg via INTRAVENOUS
  Filled 2020-04-15: qty 2

## 2020-04-15 MED ORDER — EPINEPHRINE 0.3 MG/0.3ML IJ SOAJ: 0.3000 mg | INTRAMUSCULAR | 0 refills | Status: DC | PRN

## 2020-04-15 MED ORDER — PREDNISONE 20 MG PO TABS: ORAL_TABLET | ORAL | 0 refills | Status: DC

## 2020-04-15 NOTE — ED Notes (Signed)
Presents with rash that began this am, took pepcid, hands began to have swelling, took 25mg  Benadryl PO at 1400hrs today, no response, then began having lip swelling and had some difficulty with swallowing.

## 2020-04-15 NOTE — Discharge Instructions (Signed)
Take prednisone as prescribed.  See your allergist.  Carry EpiPen with you.  Continue Benadryl 50 mg every 6 hours for itchiness  Return to ER if you have throat closing, trouble breathing, worse rash

## 2020-04-15 NOTE — ED Provider Notes (Signed)
[facility] EMERGENCY DEPARTMENT Provider Note   CSN: [id number] Arrival date & time: 04/15/20  2002     History Chief Complaint  Patient presents with  . Allergic Reaction    Tanya Manning is a 36 y.o. female history of atopic eczema and idiopathic urticaria who presenting with rash.  Patient has been dealing with this for a long time.  Patient was eventually diagnosed with autoimmune disease causing her hives.  Patient is on Xyzal at baseline.  Patient has seen allergist previously for the same problem.  Patient states that she did not have any new shampoos or new detergents or medicines.  She started breaking out in hives this morning.  She tried to take her Benadryl with no relief.  Patient was seen here previously and improved with steroids and IV Benadryl and Pepcid.  Denies throat closing  The history is provided by the patient.       Past Medical History:  Diagnosis Date  . Atopic eczema 12/10/2019  . Urticaria     Patient Active Problem List   Diagnosis Date Noted  . Atopic eczema 12/10/2019  . Chronic urticaria 08/27/2019  . Allergic reaction 08/27/2019  . Angioedema 08/27/2019  . Other forms of dyspnea 08/27/2019  . Chronic rhinitis 08/27/2019    Past Surgical History:  Procedure Laterality Date  . CESAREAN SECTION    . NOSE SURGERY       OB History   No obstetric history on file.     Family History  Problem Relation Age of Onset  . Allergic rhinitis Brother   . Angioedema Neg Hx   . Asthma Neg Hx   . Atopy Neg Hx   . Eczema Neg Hx   . Immunodeficiency Neg Hx   . Urticaria Neg Hx     Social History   Tobacco Use  . Smoking status: Never Smoker  . Smokeless tobacco: Never Used  Vaping Use  . Vaping Use: Never used  Substance Use Topics  . Alcohol use: No  . Drug use: No    Home Medications Prior to Admission medications   Medication Sig Start Date End Date Taking? Authorizing Provider  albuterol (PROAIR HFA) 108  (90 Base) MCG/ACT inhaler Inhale 2 puffs into the lungs every 4 (four) hours as needed for wheezing or shortness of breath. 08/27/19   Bobbitt, Heywood Iles, MD  azelastine (ASTELIN) 0.1 % nasal spray 1-2 sorays per nostril bid as needed 09/29/19   Bobbitt, Heywood Iles, MD  Cetirizine HCl (ZYRTEC PO) Take 1 tablet by mouth in the morning and at bedtime.    [provider]  EPINEPHrine 0.3 mg/0.3 mL IJ SOAJ injection Inject 0.3 mLs (0.3 mg total) into the muscle as needed for anaphylaxis. 08/27/19   Mesner, Barbara Cower, MD  EPINEPHrine 0.3 mg/0.3 mL IJ SOAJ injection Inject 0.3 mg into the muscle as needed for anaphylaxis. 01/18/20   Hyman Hopes, Margaux, PA-C  famotidine (PEPCID) 20 MG tablet Take 1 tablet (20 mg total) by mouth 2 (two) times daily. 08/27/19   Bobbitt, Heywood Iles, MD  fluticasone (FLONASE) 50 MCG/ACT nasal spray Place 2 sprays into both nostrils daily. 10/03/19   Nehemiah Settle, FNP  fluticasone (FLOVENT HFA) 110 MCG/ACT inhaler Inhale 2 puffs into the lungs 2 (two) times daily. Use with spacer and rinse mouth out after. Patient not taking: No sig reported 10/03/19   Nehemiah Settle, FNP  levocetirizine (XYZAL) 5 MG tablet Take 1 tablet (5 mg total) by mouth every evening.  Patient not taking: Reported on 01/18/2020 08/27/19   Bobbitt, Heywood Iles, MD  predniSONE (DELTASONE) 10 MG tablet 2 tablets twice a day for 3 days, then take 2 tablets once a day for 1 day, then take 1 tablet on the 5th day, then stop 04/08/20   Ambs, Norvel Richards, FNP  triamcinolone ointment (KENALOG) 0.1 % Apply twice daily as needed to red itchy areas below the face. 12/10/19   Bobbitt, Heywood Iles, MD    Allergies    Patient has no known allergies.  Review of Systems   Review of Systems  Skin: Positive for rash.  All other systems reviewed and are negative.   Physical Exam Updated Vital Signs BP (!) 97/48   Pulse 87   Temp 98.2 F (36.8 C) (Oral)   Resp (!) 22   Ht 5\' 6"  (1.676 m)   Wt 85.7 kg   LMP  03/18/2020   SpO2 98%   BMI 30.51 kg/m   Physical Exam Vitals and nursing note reviewed.  Constitutional:      Appearance: Normal appearance.  HENT:     Head: Normocephalic.     Nose: Nose normal.     Mouth/Throat:     Mouth: Mucous membranes are moist.     Comments: OP clear  Eyes:     Extraocular Movements: Extraocular movements intact.     Pupils: Pupils are equal, round, and reactive to light.  Cardiovascular:     Rate and Rhythm: Normal rate and regular rhythm.     Pulses: Normal pulses.     Heart sounds: Normal heart sounds.  Pulmonary:     Effort: Pulmonary effort is normal.     Breath sounds: Normal breath sounds.  Abdominal:     General: Abdomen is flat.     Palpations: Abdomen is soft.  Musculoskeletal:        General: Normal range of motion.     Cervical back: Normal range of motion and neck supple.  Skin:    Capillary Refill: Capillary refill takes less than 2 seconds.     Comments: Diffuse urticaria on arms and legs and torso, not involving mucous membranes   Neurological:     General: No focal deficit present.     Mental Status: She is alert and oriented to person, place, and time.  Psychiatric:        Mood and Affect: Mood normal.        Behavior: Behavior normal.     ED Results / Procedures / Treatments   Labs (all labs ordered are listed, but only abnormal results are displayed) Labs Reviewed  CBC WITH DIFFERENTIAL/PLATELET - Abnormal; Notable for the following components:      Result Value   WBC 13.3 (*)    Neutro Abs 7.9 (*)    Monocytes Absolute 1.1 (*)    All other components within normal limits  COMPREHENSIVE METABOLIC PANEL - Abnormal; Notable for the following components:   CO2 21 (*)    All other components within normal limits    EKG None  Radiology No results found.  Procedures Procedures   Medications Ordered in ED Medications  famotidine (PEPCID) IVPB 20 mg premix (0 mg Intravenous Stopped 04/15/20 2115)   diphenhydrAMINE (BENADRYL) injection 50 mg (50 mg Intravenous Given 04/15/20 2045)  methylPREDNISolone sodium succinate (SOLU-MEDROL) 125 mg/2 mL injection 125 mg (125 mg Intravenous Given 04/15/20 2046)    ED Course  I have reviewed the triage vital signs and the nursing notes.  Pertinent labs & imaging results that were available during my care of the patient were reviewed by me and considered in my medical decision making (see chart for details).    MDM Rules/Calculators/A&P                         Lucelia Blaize is a 35 y.o. female here with urticaria. Has hx of idiopathic urticaria. Will give solumedrol, pepcid, benadryl. No signs of anaphylaxis right now.   11:12 PM Rash improved. Stable for discharge.    Final Clinical Impression(s) / ED Diagnoses Final diagnoses:  None    Rx / DC Orders ED Discharge Orders    None       Charlynne Pander, MD 04/15/20 2314

## 2020-04-15 NOTE — ED Triage Notes (Signed)
Pt reports she has hx of allergic reactions r/t to an autoimmune disease-hives started this am-swelling to lips and throat x 20 min-benadryl and pepcid at 2pm-NAD-steady gait

## 2020-04-16 ENCOUNTER — Other Ambulatory Visit: Payer: Self-pay

## 2020-04-16 ENCOUNTER — Ambulatory Visit (INDEPENDENT_AMBULATORY_CARE_PROVIDER_SITE_OTHER): Payer: Medicaid Other | Admitting: Family

## 2020-04-16 ENCOUNTER — Encounter: Payer: Self-pay | Admitting: Family

## 2020-04-16 VITALS — BP 114/80 | HR 100 | Temp 98.7°F | Resp 16

## 2020-04-16 DIAGNOSIS — L508 Other urticaria: Secondary | ICD-10-CM | POA: Diagnosis not present

## 2020-04-16 MED ORDER — MONTELUKAST SODIUM 10 MG PO TABS: ORAL_TABLET | ORAL | 5 refills | Status: DC

## 2020-04-16 MED ORDER — FAMOTIDINE 20 MG PO TABS: 20.0000 mg | ORAL_TABLET | Freq: Two times a day (BID) | ORAL | 5 refills | Status: DC

## 2020-04-16 MED ORDER — LEVOCETIRIZINE DIHYDROCHLORIDE 5 MG PO TABS: ORAL_TABLET | ORAL | 5 refills | Status: DC

## 2020-04-16 NOTE — Progress Notes (Addendum)
[address] [place] [address] Dept: [phone]  FOLLOW UP NOTE  Patient ID: Tanya Manning, female    DOB: November 20, 1985  Age: 35 y.o. MRN: [id number] Date of Office Visit: 04/16/2020  Assessment  Chief Complaint: Urticaria  HPI Tanya Manning is a 35 year old female who presents today for an acute visit of hives and swelling.  She was last seen on December 10, 2019 by Dr. Nunzio Cobbs for chronic urticaria and atopic eczema.  Chronic urticaria is reported as not well controlled with Pepcid 20 mg twice a day , Xyzal 5 mg 1 tablet twice a day and Xolair 300 mg every 4 weeks.  She went to the emergency room yesterday for hives and swelling of the face, lips and feeling like her throat was closing in.  She did not use her EpiPen.  While she was there she was given Solu-Medrol, Benadryl, and famotidine.  She was also given a steroid taper to take at home.  This morning she took 60 mg of prednisone and Claritin.  Prior to starting Claritin this morning she was on Xyzal and she did not feel like this was helping.  She has also tried Allegra 1 tablet twice a day for 3 days and this did not do anything so she switched back to Xyzal.  Instructed that it would take more than 3 days for the antihistamine to get in her system and work.  She reports that she has been late for her Xolair injection due to a tree falling in her yard and being in school.  She also feels like the stress of school and work is causing her hives to be worse.  She did feel like her first Xolair injection helped her hives, but then she reports being late few times for her Xolair injections.  She denies any new foods, medications, products, or detergents.  Her urticaria labs from September 18, 2019 indicate autoimmune urticaria.  Drug Allergies:  No Known Allergies  Review of Systems: Review of Systems  Constitutional: Positive for chills. Negative for fever.  HENT:       Reports nasal congestion and denies  rhinorrhea and postnasal drip  Eyes:       Reports watery eyes and denies itchy eyes  Respiratory: Negative for cough, shortness of breath and wheezing.   Cardiovascular: Negative for chest pain and palpitations.  Gastrointestinal: Negative for abdominal pain and heartburn.  Genitourinary: Negative for dysuria.  Skin: Positive for itching and rash.  Neurological: Positive for headaches.       Reports that prednisone causes headaches    Physical Exam: BP 114/80 (BP Location: Right Arm, Patient Position: Sitting, Cuff Size: Normal)   Pulse 100   Temp 98.7 F (37.1 C) (Temporal)   Resp 16   LMP 03/18/2020   SpO2 100%    Physical Exam Constitutional:      Appearance: Normal appearance.  HENT:     Head: Normocephalic and atraumatic.     Comments: Pharynx normal, eyes normal, ears normal, nose normal    Right Ear: Tympanic membrane, ear canal and external ear normal.     Left Ear: Tympanic membrane, ear canal and external ear normal.     Nose: Nose normal.     Mouth/Throat:     Mouth: Mucous membranes are moist.     Pharynx: Oropharynx is clear.  Eyes:     Conjunctiva/sclera: Conjunctivae normal.  Cardiovascular:     Rate and Rhythm: Regular rhythm.  Heart sounds: Normal heart sounds.  Pulmonary:     Effort: Pulmonary effort is normal.     Breath sounds: Normal breath sounds.     Comments: Lungs clear to auscultation Musculoskeletal:     Cervical back: Neck supple.  Skin:    General: Skin is warm.     Comments: Excoriation marks and erythema noted on bilateral arms and chest.  Erythema noted on face  Neurological:     Mental Status: She is alert and oriented to person, place, and time.  Psychiatric:        Mood and Affect: Mood normal.        Behavior: Behavior normal.        Thought Content: Thought content normal.        Judgment: Judgment normal.     Diagnostics: None  Assessment and Plan: 1. Chronic urticaria     Patient Instructions  Chronic  urticaria Continue prednisone as prescribed from the emergency room Stop Claritin Start levocetirizine (Xyzal) 5mg  2 tablets twice a day.  Caution as this can make you drowsy Continue famotidine (Pepcid) 20 mg 1 tablet twice a day Start montelukast (Singulair ) 10 mg at night Patient cautioned that rarely some children/adults can experience behavioral changes after beginning montelukast. These side effects are rare, however, if you notice any change, notify the clinic and discontinue montelukast. Continue Xolair injections 300 mg every 4 weeks.  Try to be more consistent with your appointments  EpiPen up-to-date  Atopic eczema Resolved Continue moisturizing with Aquaphor  Please let know if this treatment plan is not working well for you Schedule a follow-up appointment in 4 weeks    Return in about 4 weeks (around 05/14/2020), or if symptoms worsen or fail to improve.    Thank you for the opportunity to care for this patient.  Please do not hesitate to contact me with questions.  07/14/2020, FNP [facility]  I have provided oversight concerning [facility] Tanya Manning's evaluation and treatment of this patient's health issues addressed during today's encounter. I agree with the assessment and therapeutic plan as outlined in the note.   Signed,   Wynona Canes, MD,  Allergy and Immunology,  [facility] [place].

## 2020-04-16 NOTE — Patient Instructions (Signed)
Chronic urticaria Continue prednisone as prescribed from the emergency room Stop Claritin Start levocetirizine (Xyzal) 5mg  2 tablets twice a day.  Caution as this can make you drowsy Continue famotidine (Pepcid) 20 mg 1 tablet twice a day Start montelukast (Singulair ) 10 mg at night Patient cautioned that rarely some children/adults can experience behavioral changes after beginning montelukast. These side effects are rare, however, if you notice any change, notify the clinic and discontinue montelukast. Continue Xolair injections 300 mg every 4 weeks.  Try to be more consistent with your appointments  EpiPen up-to-date  Atopic eczema Resolved Continue moisturizing with Aquaphor  Please let know if this treatment plan is not working well for you Schedule a follow-up appointment in 4 weeks

## 2020-05-03 ENCOUNTER — Ambulatory Visit (INDEPENDENT_AMBULATORY_CARE_PROVIDER_SITE_OTHER): Payer: Medicaid Other

## 2020-05-03 ENCOUNTER — Other Ambulatory Visit: Payer: Self-pay

## 2020-05-03 DIAGNOSIS — L501 Idiopathic urticaria: Secondary | ICD-10-CM | POA: Diagnosis not present

## 2020-05-31 ENCOUNTER — Other Ambulatory Visit: Payer: Self-pay

## 2020-05-31 ENCOUNTER — Ambulatory Visit (INDEPENDENT_AMBULATORY_CARE_PROVIDER_SITE_OTHER): Payer: Medicaid Other

## 2020-05-31 DIAGNOSIS — L501 Idiopathic urticaria: Secondary | ICD-10-CM

## 2020-06-28 ENCOUNTER — Other Ambulatory Visit: Payer: Self-pay

## 2020-06-28 ENCOUNTER — Ambulatory Visit (INDEPENDENT_AMBULATORY_CARE_PROVIDER_SITE_OTHER): Payer: Medicaid Other

## 2020-06-28 DIAGNOSIS — L501 Idiopathic urticaria: Secondary | ICD-10-CM

## 2020-07-26 ENCOUNTER — Ambulatory Visit (INDEPENDENT_AMBULATORY_CARE_PROVIDER_SITE_OTHER): Payer: Medicaid Other | Admitting: Family Medicine

## 2020-07-26 ENCOUNTER — Other Ambulatory Visit: Payer: Self-pay

## 2020-07-26 ENCOUNTER — Encounter: Payer: Self-pay | Admitting: Family Medicine

## 2020-07-26 ENCOUNTER — Ambulatory Visit (INDEPENDENT_AMBULATORY_CARE_PROVIDER_SITE_OTHER): Payer: Medicaid Other

## 2020-07-26 VITALS — BP 120/74 | HR 99 | Temp 98.1°F | Resp 18 | Ht 65.5 in | Wt 187.8 lb

## 2020-07-26 DIAGNOSIS — L500 Allergic urticaria: Secondary | ICD-10-CM

## 2020-07-26 DIAGNOSIS — L501 Idiopathic urticaria: Secondary | ICD-10-CM

## 2020-07-26 DIAGNOSIS — L2089 Other atopic dermatitis: Secondary | ICD-10-CM

## 2020-07-26 NOTE — Progress Notes (Signed)
[address] [place] [address] Dept: [phone]  FOLLOW UP NOTE  Patient ID: Tanya Manning, female    DOB: 10/20/1985  Age: 35 y.o. MRN: [id number] Date of Office Visit: 07/26/2020  Assessment  Chief Complaint: Follow-up (F/u for hives and occasional breakouts. All overall body and face. No refills needed.)  HPI Tanya Manning is a 36 year old female who presents to the clinic for follow-up visit.  She was last seen in this clinic on 04/16/2020 by Nehemiah Settle, FNP, for evaluation of chronic urticaria.  At today's visit, she reports her chronic urticaria has been poorly controlled with breakouts beginning about 2 to 2-1/2 weeks after she receives her Xolair injection and resolving with the next Xolair injection.  She reports that breakouts occur about 1-2 times a month lasting between 2 days and several weeks.  She continues Xyzal 5 mg 2 tablets twice a day and famotidine 20 mg twice a day.  She has tried montelukast for a therapeutic 30-day trial, however, reports insomnia while taking montelukast.  She has stopped this medication for about 2 months.  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  She also reports participating in a recent televisit where she was diagnosed with a viral illness and interestingly prescribed an antibiotic.  She reports no improvement in her symptoms with the antibiotic.  She denies fever and body aches and sick contacts.  Her current medications are listed in the chart.  Drug Allergies:  No Known Allergies  Physical Exam: BP 120/74 (BP Location: Right Arm, Patient Position: Sitting, Cuff Size: Normal)   Pulse 99   Temp 98.1 F (36.7 C) (Temporal)   Resp 18   Ht 5' 5.5" (1.664 m)   Wt 187 lb 12.8 oz (85.2 kg)   SpO2 98%   BMI 30.78 kg/m    Physical Exam Vitals reviewed.  Constitutional:      Appearance: Normal appearance.  HENT:     Head: Normocephalic and atraumatic.      Right Ear: Tympanic membrane normal.     Left Ear: Tympanic membrane normal.     Mouth/Throat:     Pharynx: Oropharynx is clear.  Eyes:     Conjunctiva/sclera: Conjunctivae normal.  Cardiovascular:     Rate and Rhythm: Normal rate and regular rhythm.     Heart sounds: Normal heart sounds. No murmur heard. Pulmonary:     Effort: Pulmonary effort is normal.     Breath sounds: Normal breath sounds.     Comments: Lungs clear to auscultation Musculoskeletal:        General: Normal range of motion.     Cervical back: Normal range of motion and neck supple.  Skin:    General: Skin is warm and dry.  Neurological:     Mental Status: She is alert and oriented to person, place, and time.  Psychiatric:        Mood and Affect: Mood normal.        Behavior: Behavior normal.        Thought Content: Thought content normal.        Judgment: Judgment normal.      Assessment and Plan: 1. Idiopathic urticaria   2. Other atopic dermatitis     Patient Instructions  Chronic urticaria Use the least amount of medications while remaining hive free Levocetirizine (Xyzal) 5 mg twice a day and famotidine (Pepcid) 20 mg twice a day. If no symptoms for 7-14 days  then decrease to. Levocetirizine (Xyzal) 5 mg twice a day and famotidine (Pepcid) 20 mg once a day.  If no symptoms for 7-14 days then decrease to. Levocetirizine (Xyzal) 5 mg twice a day.  If no symptoms for 7-14 days then decrease to. Levocetirizine (Xyzal) 5 mg once a day.  Will try to increase Xolair injections 300 mg to once every 2 weeks. You will next hear from out biologics coordinator, Tammy, regarding the Xolair injections.  If your symptoms re-occur, begin a journal of events that occurred for up to 6 hours before your symptoms began including foods and beverages consumed, soaps or perfumes you had contact with, and medications.   Atopic eczema Continue moisturizing with Aquaphor  Call the clinic if this treatment plan is not  working well for you  Follow up in 3 months or sooner if needed.   Return in about 3 months (around 10/26/2020), or if symptoms worsen or fail to improve.    Thank you for the opportunity to care for this patient.  Please do not hesitate to contact me with questions.  Thermon Leyland, FNP [facility]

## 2020-07-26 NOTE — Patient Instructions (Signed)
Chronic urticaria Use the least amount of medications while remaining hive free Levocetirizine (Xyzal) 5 mg twice a day and famotidine (Pepcid) 20 mg twice a day. If no symptoms for 7-14 days then decrease to. Levocetirizine (Xyzal) 5 mg twice a day and famotidine (Pepcid) 20 mg once a day.  If no symptoms for 7-14 days then decrease to. Levocetirizine (Xyzal) 5 mg twice a day.  If no symptoms for 7-14 days then decrease to. Levocetirizine (Xyzal) 5 mg once a day.  Will try to increase Xolair injections 300 mg to once every 2 weeks. You will next hear from out biologics coordinator, Tammy, regarding the Xolair injections.  If your symptoms re-occur, begin a journal of events that occurred for up to 6 hours before your symptoms began including foods and beverages consumed, soaps or perfumes you had contact with, and medications.   Atopic eczema Continue moisturizing with Aquaphor  Call the clinic if this treatment plan is not working well for you  Follow up in 3 months or sooner if needed.

## 2020-07-27 ENCOUNTER — Encounter: Payer: Self-pay | Admitting: Family Medicine

## 2020-08-05 ENCOUNTER — Encounter: Payer: Self-pay | Admitting: Family Medicine

## 2020-08-05 NOTE — Telephone Encounter (Signed)
Tammy has been notified of the change in Xolair. Patient notified that this process has been started.

## 2020-08-10 ENCOUNTER — Telehealth: Payer: Self-pay | Admitting: *Deleted

## 2020-08-10 MED ORDER — OMALIZUMAB 150 MG/ML ~~LOC~~ SOSY: 300.0000 mg | PREFILLED_SYRINGE | SUBCUTANEOUS | 11 refills | Status: DC

## 2020-08-10 NOTE — Telephone Encounter (Signed)
Called patient and advised with next injection will increase to every 14 days

## 2020-08-13 NOTE — Telephone Encounter (Signed)
Can you please let this patient know that I am sending the message to Tammy for further evaluation

## 2020-08-18 ENCOUNTER — Other Ambulatory Visit: Payer: Self-pay | Admitting: *Deleted

## 2020-08-23 ENCOUNTER — Ambulatory Visit (INDEPENDENT_AMBULATORY_CARE_PROVIDER_SITE_OTHER): Payer: Medicaid Other | Admitting: *Deleted

## 2020-08-23 ENCOUNTER — Other Ambulatory Visit: Payer: Self-pay

## 2020-08-23 DIAGNOSIS — L501 Idiopathic urticaria: Secondary | ICD-10-CM

## 2020-09-06 ENCOUNTER — Ambulatory Visit: Payer: Self-pay

## 2020-09-07 ENCOUNTER — Other Ambulatory Visit: Payer: Self-pay

## 2020-09-07 ENCOUNTER — Ambulatory Visit (INDEPENDENT_AMBULATORY_CARE_PROVIDER_SITE_OTHER): Payer: Medicaid Other

## 2020-09-07 DIAGNOSIS — L500 Allergic urticaria: Secondary | ICD-10-CM | POA: Diagnosis not present

## 2020-09-07 MED ORDER — OMALIZUMAB 150 MG ~~LOC~~ SOLR
300.0000 mg | SUBCUTANEOUS | Status: DC
Start: 2020-09-07 — End: 2020-12-13
  Administered 2020-09-07 – 2020-12-06 (×7): 300 mg via SUBCUTANEOUS

## 2020-09-20 ENCOUNTER — Ambulatory Visit: Payer: Self-pay

## 2020-09-20 NOTE — Telephone Encounter (Signed)
I called and spoke to patient and advised her that due to delay in shipment last injection her next delivery will not be till tomorrow and the reason her medication not there today. R/s appt for tomorrow

## 2020-09-21 ENCOUNTER — Other Ambulatory Visit: Payer: Self-pay

## 2020-09-21 ENCOUNTER — Ambulatory Visit (INDEPENDENT_AMBULATORY_CARE_PROVIDER_SITE_OTHER): Payer: Medicaid Other

## 2020-09-21 DIAGNOSIS — L501 Idiopathic urticaria: Secondary | ICD-10-CM

## 2020-10-04 ENCOUNTER — Other Ambulatory Visit: Payer: Self-pay

## 2020-10-04 ENCOUNTER — Ambulatory Visit: Payer: Medicaid Other

## 2020-10-04 DIAGNOSIS — L501 Idiopathic urticaria: Secondary | ICD-10-CM | POA: Diagnosis not present

## 2020-10-18 ENCOUNTER — Encounter: Payer: Self-pay | Admitting: Family Medicine

## 2020-10-18 ENCOUNTER — Ambulatory Visit: Payer: Medicaid Other

## 2020-10-21 ENCOUNTER — Other Ambulatory Visit: Payer: Self-pay

## 2020-10-21 ENCOUNTER — Ambulatory Visit (INDEPENDENT_AMBULATORY_CARE_PROVIDER_SITE_OTHER): Payer: Medicaid Other

## 2020-10-21 DIAGNOSIS — L501 Idiopathic urticaria: Secondary | ICD-10-CM

## 2020-11-01 ENCOUNTER — Ambulatory Visit: Payer: Medicaid Other

## 2020-11-01 ENCOUNTER — Ambulatory Visit (INDEPENDENT_AMBULATORY_CARE_PROVIDER_SITE_OTHER): Payer: Medicaid Other

## 2020-11-01 ENCOUNTER — Other Ambulatory Visit: Payer: Self-pay

## 2020-11-01 DIAGNOSIS — L501 Idiopathic urticaria: Secondary | ICD-10-CM

## 2020-11-15 ENCOUNTER — Ambulatory Visit: Payer: Medicaid Other

## 2020-11-16 ENCOUNTER — Other Ambulatory Visit: Payer: Self-pay

## 2020-11-16 ENCOUNTER — Ambulatory Visit (INDEPENDENT_AMBULATORY_CARE_PROVIDER_SITE_OTHER): Payer: Medicaid Other

## 2020-11-16 DIAGNOSIS — L501 Idiopathic urticaria: Secondary | ICD-10-CM | POA: Diagnosis not present

## 2020-11-22 ENCOUNTER — Encounter: Payer: Self-pay | Admitting: *Deleted

## 2020-11-22 ENCOUNTER — Ambulatory Visit: Payer: Medicaid Other | Admitting: Internal Medicine

## 2020-11-22 NOTE — Progress Notes (Deleted)
FOLLOW UP Date of Service/Encounter:  11/22/20   Subjective:  Tanya Manning (DOB: 18-Aug-1985) is a 35 y.o. female who [facility] 11[facility]22/2020 in re-evaluation of the following: Chronic idiopathic urticaria History obtained from: chart review and {Persons; PED relatives w/patient:19415::"patient"}.  For Review, LV was on 07/27/20  with Thermon Leyland, FNP seen for chronic idiopathic urticaria.  At that visit she was having high flares about 2 weeks following her Xolair injection.  Plan at that time was to increase dose to 300 mg every 2 weeks.  Additionally taking Xyzal 5 mg twice a day and Pepcid 20 mg twice a day.  Allergies as of 11/22/2020   No Known Allergies      Medication List        Accurate as of November 22, 2020  8:33 AM. If you have any questions, ask your nurse or doctor.          azelastine 0.1 % nasal spray Commonly known as: ASTELIN 1-2 sorays per nostril bid as needed   EPINEPHrine 0.3 mg/0.3 mL Soaj injection Commonly known as: EPI-PEN Inject 0.3 mg into the muscle as needed for anaphylaxis.   famotidine 20 MG tablet Commonly known as: Pepcid Take 1 tablet (20 mg total) by mouth 2 (two) times daily.   Flovent HFA 110 MCG/ACT inhaler Generic drug: fluticasone Inhale 2 puffs into the lungs 2 (two) times daily. Use with spacer and rinse mouth out after.   fluticasone 50 MCG/ACT nasal spray Commonly known as: FLONASE Place 2 sprays into both nostrils daily.   levocetirizine 5 MG tablet Commonly known as: XYZAL Take 2 tablets twice daily for hives   montelukast 10 MG tablet Commonly known as: SINGULAIR Take one tablet once at night for hives   omalizumab 150 MG/ML prefilled syringe Commonly known as: Xolair Inject 300 mg into the skin every 14 (fourteen) days.       Past Medical History:  Diagnosis Date   Atopic eczema 12/10/2019   Urticaria    Past Surgical History:  Procedure Laterality Date    CESAREAN SECTION     NOSE SURGERY     Otherwise, there have been no changes to her past medical history, surgical history, family history, or social history.  ROS: All others negative except as noted per HPI.   Objective:  There were no vitals taken for this visit. There is no height or weight on file to calculate BMI. Physical Exam: General Appearance:  Alert, cooperative, no distress, appears stated age  Head:  Normocephalic, without obvious abnormality, atraumatic  Eyes:  Conjunctiva clear, EOM's intact  Nose: Nares normal  Throat: Lips, tongue normal; teeth and gums normal  Neck: Supple, symmetrical  Lungs:   Respirations unlabored, no coughing  Heart:  Appears well perfused  Extremities: No edema  Skin: Skin color, texture, turgor normal, no rashes or lesions on visualized portions of skin  Neurologic: No gross deficits   Reviewed: ***  Spirometry:  Tracings reviewed. Her effort: {Blank single:19197::"Good reproducible efforts.","It was hard to get consistent efforts and there is a question as to whether this reflects a maximal maneuver.","Poor effort, data can not be interpreted."} FVC: ***L FEV1: ***L, ***% predicted FEV1/FVC ratio: ***% Interpretation: {Blank single:19197::"Spirometry consistent with mild obstructive disease","Spirometry consistent with moderate obstructive disease","Spirometry consistent with severe obstructive disease","Spirometry consistent with possible restrictive disease","Spirometry consistent with mixed obstructive and restrictive disease","Spirometry uninterpretable due to technique","Spirometry consistent with normal pattern","No overt abnormalities noted given today's efforts"}.  Please see scanned spirometry results for details.  Skin Testing: {Blank single:19197::"Select foods","Environmental allergy panel","Environmental allergy panel and select foods","Food allergy panel","None","Deferred due to recent antihistamines use"}. Positive test to:  ***. Negative test to: ***.  Results discussed with patient/family.   {Blank single:19197::"Allergy testing results were read and interpreted by myself, documented by clinical staff."," "}  Assessment:  No diagnosis found.  Plan/Recommendations:  There are no Patient Instructions on file for this visit.  No follow-ups on file.  Tonny Bollman, MD  [facility]

## 2020-11-30 ENCOUNTER — Ambulatory Visit: Payer: Medicaid Other

## 2020-12-06 ENCOUNTER — Ambulatory Visit (INDEPENDENT_AMBULATORY_CARE_PROVIDER_SITE_OTHER): Payer: Medicaid Other

## 2020-12-06 ENCOUNTER — Other Ambulatory Visit: Payer: Self-pay

## 2020-12-06 DIAGNOSIS — L501 Idiopathic urticaria: Secondary | ICD-10-CM

## 2020-12-10 NOTE — Progress Notes (Signed)
FOLLOW UP Date of Service/Encounter:  12/13/20   Subjective:  Tanya Manning (DOB: 1985-03-02) is a 35 y.o. female who returns to the [facility] [place] on 12/13/2020 in re-evaluation of the following: Chronic hives and atopic dermatitis History obtained from: chart review and patient.  For Review, LV was on 07/26/2020 with Gareth Morgan, FNP seen for chronic urticaria on Xolair, Xyzal twice daily and famotidine twice daily.  Unable to tolerate montelukast due to insomnia.  Started on increased Xolair dosing at 300 mg twice daily at last visit. Additionally has mild atopic eczema that is controlled with daily moisturizer.  Today presents for follow-up. Hives started when she was around 35 yo.  She is finally controlled since starting increased dosing on Xolair.  She has been able to tolerate decreasing Xyzal to 2 tablets daily and 1 tablet of famotidine.  She hasn't been to the ER in 1 year for throat closing which is also progress for her.  She does continue to get little tiny hives on her arms and they don't last as long.  Now resolving within 30 minutes.  She is so pleased with her current level of control. She has a prescription for Flovent 110 but states this was provided during an episode of hives when she felt throat closing, and she has never used outside of that one episode (over one year ago). When she started her hives journey, she was told by an outside allergist that it was "in her head" so she did see psychologist for her symptoms who told her symptoms were real.  She was then referred to Dr. Verlin Fester who finally started treating her for chronic hives. Atopic dermatitis well controlled.  Previous diagnostics:  CU index > 50 C4 normal, tryptase normal, thyro peroxidase, thyroglobulin antibody normal IgG H. Pylori positive with negative IgA and IgM Alpha gal negative, ANA negative, ESR 8    Allergies as of 12/13/2020   No Known Allergies      Medication List         Accurate as of December 13, 2020 11:18 AM. If you have any questions, ask your nurse or doctor.          STOP taking these medications    Flovent HFA 110 MCG/ACT inhaler Generic drug: fluticasone Stopped by: Sigurd Sos, MD   montelukast 10 MG tablet Commonly known as: SINGULAIR Stopped by: Sigurd Sos, MD       TAKE these medications    azelastine 0.1 % nasal spray Commonly known as: ASTELIN 1-2 sorays per nostril bid as needed   EPINEPHrine 0.3 mg/0.3 mL Soaj injection Commonly known as: EPI-PEN Inject 0.3 mg into the muscle as needed for anaphylaxis.   famotidine 20 MG tablet Commonly known as: PEPCID Take 1 tablet by mouth 2 (two) times daily. What changed: Another medication with the same name was removed. Continue taking this medication, and follow the directions you see here. Changed by: Sigurd Sos, MD   fluticasone 50 MCG/ACT nasal spray Commonly known as: FLONASE Place 2 sprays into both nostrils daily.   levocetirizine 5 MG tablet Commonly known as: XYZAL Take 2 tablets twice daily for hives   omalizumab 150 MG/ML prefilled syringe Commonly known as: Xolair Inject 300 mg into the skin every 14 (fourteen) days. What changed: Another medication with the same name was removed. Continue taking this medication, and follow the directions you see here. Changed by: Sigurd Sos, MD       Past Medical History:  Diagnosis Date   Atopic eczema 12/10/2019   Urticaria    Past Surgical History:  Procedure Laterality Date   CESAREAN SECTION     NOSE SURGERY     Otherwise, there have been no changes to her past medical history, surgical history, family history, or social history.  ROS: All others negative except as noted per HPI.   Objective:  BP 106/70 (BP Location: Right Arm, Patient Position: Sitting, Cuff Size: Normal)   Pulse 84   Temp 97.9 F (36.6 C) (Temporal)   Resp 20   SpO2 98%  There is no height or weight on file to calculate  BMI. Physical Exam: General Appearance:  Alert, cooperative, no distress, appears stated age  Head:  Normocephalic, without obvious abnormality, atraumatic  Eyes:  Conjunctiva clear, EOM's intact  Nose: Nares normal, normal mucosa  Throat: Lips, tongue normal; teeth and gums normal, norma posterior oropharynx  Neck: Supple, symmetrical  Lungs:   CTAB, Respirations unlabored, no coughing  Heart:  Appears well perfused  Extremities: No edema  Skin: Skin color, texture, turgor normal, no rashes or lesions on visualized portions of skin, no hives  Neurologic: No gross deficits   Assessment/Plan  Her hives are finally controlled at a level that she feels is acceptable for quality of life.  She does continue to have occasional flares, so it is pertinent that she continue her current medication regimen.  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 If no symptoms for 7-14 days then decrease to. Levocetirizine (Xyzal) 5 mg twice a day and famotidine (Pepcid) 20 mg once a day.  If no symptoms for 7-14 days then decrease to. Levocetirizine (Xyzal) 5 mg twice a day.  If no symptoms for 7-14 days then decrease to. Levocetirizine (Xyzal) 5 mg once a day.  Continue Xolair injections 300 mg once every 2 weeks.   Atopic eczema Continue moisturizing with Aquaphor  Call the clinic if this treatment plan is not working well for you.  Follow up in 6 months or sooner if needed.   Sigurd Sos, MD  [facility]

## 2020-12-13 ENCOUNTER — Other Ambulatory Visit: Payer: Self-pay

## 2020-12-13 ENCOUNTER — Encounter: Payer: Self-pay | Admitting: Internal Medicine

## 2020-12-13 ENCOUNTER — Ambulatory Visit (INDEPENDENT_AMBULATORY_CARE_PROVIDER_SITE_OTHER): Payer: Medicaid Other | Admitting: Internal Medicine

## 2020-12-13 VITALS — BP 106/70 | HR 84 | Temp 97.9°F | Resp 20

## 2020-12-13 DIAGNOSIS — L2089 Other atopic dermatitis: Secondary | ICD-10-CM | POA: Diagnosis not present

## 2020-12-13 DIAGNOSIS — L508 Other urticaria: Secondary | ICD-10-CM

## 2020-12-13 MED ORDER — EPINEPHRINE 0.3 MG/0.3ML IJ SOAJ: 0.3000 mg | INTRAMUSCULAR | 1 refills | Status: DC | PRN

## 2020-12-13 MED ORDER — LEVOCETIRIZINE DIHYDROCHLORIDE 5 MG PO TABS: ORAL_TABLET | ORAL | 5 refills | Status: DC

## 2020-12-13 MED ORDER — EPINEPHRINE 0.3 MG/0.3ML IJ SOAJ: 0.3000 mg | INTRAMUSCULAR | 0 refills | Status: DC | PRN

## 2020-12-13 NOTE — Telephone Encounter (Signed)
Had to resend the epipen 0.3 mg. Last rx was printed.

## 2020-12-13 NOTE — Patient Instructions (Addendum)
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 If no symptoms for 7-14 days then decrease to. Levocetirizine (Xyzal) 5 mg twice a day and famotidine (Pepcid) 20 mg once a day.  If no symptoms for 7-14 days then decrease to. Levocetirizine (Xyzal) 5 mg twice a day.  If no symptoms for 7-14 days then decrease to. Levocetirizine (Xyzal) 5 mg once a day.  Continue Xolair injections 300 mg once every 2 weeks.   Atopic eczema-controlled Continue moisturizing with Aquaphor  Call the clinic if this treatment plan is not working well for you.  Follow up in 6 months or sooner if needed.

## 2020-12-15 ENCOUNTER — Other Ambulatory Visit: Payer: Self-pay | Admitting: *Deleted

## 2020-12-15 MED ORDER — EPIPEN 2-PAK 0.3 MG/0.3ML IJ SOAJ: 0.3000 mg | Freq: Once | INTRAMUSCULAR | 1 refills | Status: AC

## 2020-12-20 ENCOUNTER — Ambulatory Visit: Payer: Medicaid Other

## 2020-12-21 ENCOUNTER — Other Ambulatory Visit: Payer: Self-pay | Admitting: *Deleted

## 2020-12-21 MED ORDER — EPIPEN 2-PAK 0.3 MG/0.3ML IJ SOAJ: 0.3000 mg | Freq: Once | INTRAMUSCULAR | 1 refills | Status: AC

## 2020-12-22 ENCOUNTER — Ambulatory Visit (INDEPENDENT_AMBULATORY_CARE_PROVIDER_SITE_OTHER): Payer: Medicaid Other

## 2020-12-22 DIAGNOSIS — L501 Idiopathic urticaria: Secondary | ICD-10-CM

## 2020-12-22 MED ORDER — OMALIZUMAB 150 MG ~~LOC~~ SOLR
300.0000 mg | SUBCUTANEOUS | Status: AC
  Administered 2020-12-22 – 2022-01-18 (×19): 300 mg via SUBCUTANEOUS

## 2021-01-03 ENCOUNTER — Ambulatory Visit (INDEPENDENT_AMBULATORY_CARE_PROVIDER_SITE_OTHER): Payer: Medicaid Other

## 2021-01-03 ENCOUNTER — Other Ambulatory Visit: Payer: Self-pay

## 2021-01-03 DIAGNOSIS — L501 Idiopathic urticaria: Secondary | ICD-10-CM | POA: Diagnosis not present

## 2021-01-17 ENCOUNTER — Other Ambulatory Visit: Payer: Self-pay

## 2021-01-17 ENCOUNTER — Ambulatory Visit (INDEPENDENT_AMBULATORY_CARE_PROVIDER_SITE_OTHER): Payer: Medicaid Other

## 2021-01-17 DIAGNOSIS — L501 Idiopathic urticaria: Secondary | ICD-10-CM | POA: Diagnosis not present

## 2021-02-02 ENCOUNTER — Ambulatory Visit (INDEPENDENT_AMBULATORY_CARE_PROVIDER_SITE_OTHER): Payer: Medicaid Other

## 2021-02-02 ENCOUNTER — Other Ambulatory Visit: Payer: Self-pay

## 2021-02-02 ENCOUNTER — Ambulatory Visit: Payer: Medicaid Other

## 2021-02-02 DIAGNOSIS — L501 Idiopathic urticaria: Secondary | ICD-10-CM

## 2021-02-14 ENCOUNTER — Ambulatory Visit: Payer: Medicaid Other

## 2021-02-16 ENCOUNTER — Ambulatory Visit: Payer: Medicaid Other

## 2021-02-18 ENCOUNTER — Other Ambulatory Visit: Payer: Self-pay

## 2021-02-18 ENCOUNTER — Ambulatory Visit (INDEPENDENT_AMBULATORY_CARE_PROVIDER_SITE_OTHER): Payer: Medicaid Other

## 2021-02-18 DIAGNOSIS — L501 Idiopathic urticaria: Secondary | ICD-10-CM

## 2021-03-04 ENCOUNTER — Ambulatory Visit: Payer: Medicaid Other

## 2021-03-07 ENCOUNTER — Ambulatory Visit (INDEPENDENT_AMBULATORY_CARE_PROVIDER_SITE_OTHER): Payer: Medicaid Other

## 2021-03-07 ENCOUNTER — Other Ambulatory Visit: Payer: Self-pay

## 2021-03-07 DIAGNOSIS — L501 Idiopathic urticaria: Secondary | ICD-10-CM | POA: Diagnosis not present

## 2021-03-11 ENCOUNTER — Ambulatory Visit: Payer: Medicaid Other

## 2021-03-22 ENCOUNTER — Ambulatory Visit (INDEPENDENT_AMBULATORY_CARE_PROVIDER_SITE_OTHER): Payer: Medicaid Other

## 2021-03-22 ENCOUNTER — Other Ambulatory Visit: Payer: Self-pay

## 2021-03-22 DIAGNOSIS — L501 Idiopathic urticaria: Secondary | ICD-10-CM | POA: Diagnosis not present

## 2021-04-05 ENCOUNTER — Other Ambulatory Visit: Payer: Self-pay

## 2021-04-05 ENCOUNTER — Ambulatory Visit (INDEPENDENT_AMBULATORY_CARE_PROVIDER_SITE_OTHER): Payer: Medicaid Other

## 2021-04-05 DIAGNOSIS — L501 Idiopathic urticaria: Secondary | ICD-10-CM

## 2021-04-14 ENCOUNTER — Other Ambulatory Visit: Payer: Self-pay | Admitting: *Deleted

## 2021-04-14 MED ORDER — FAMOTIDINE 20 MG PO TABS: 20.0000 mg | ORAL_TABLET | Freq: Two times a day (BID) | ORAL | 5 refills | Status: DC

## 2021-04-15 ENCOUNTER — Ambulatory Visit (INDEPENDENT_AMBULATORY_CARE_PROVIDER_SITE_OTHER): Payer: Medicaid Other

## 2021-04-15 ENCOUNTER — Other Ambulatory Visit: Payer: Self-pay

## 2021-04-15 DIAGNOSIS — L501 Idiopathic urticaria: Secondary | ICD-10-CM

## 2021-04-19 ENCOUNTER — Ambulatory Visit: Payer: Medicaid Other

## 2021-04-29 ENCOUNTER — Ambulatory Visit: Payer: Medicaid Other

## 2021-06-10 NOTE — Progress Notes (Deleted)
FOLLOW UP Date of Service/Encounter:  06/10/21   Subjective:  Tanya Manning (DOB: 04/02/1985) is a 36 y.o. female who returns to the [facility] on 06/13/2021 in re-evaluation of the following: Chronic hives and atopic dermatitis History obtained from: chart review and {Persons; PED relatives w/patient:19415::"patient"}.  For Review, LV was on 12/13/20  with Dr.Lois Slagel seen for regular follow-up.    Previous diagnostics:  CU index > 50 C4 normal, tryptase normal, thyro peroxidase, thyroglobulin antibody normal IgG H. Pylori positive with negative IgA and IgM Alpha gal negative, ANA negative, ESR 8 Xolair injections increase to 300 mg once every 2 weeks in August 2022, hives controlled with this   Allergies as of 06/13/2021   No Known Allergies      Medication List        Accurate as of Jun 10, 2021  1:12 PM. If you have any questions, ask your nurse or doctor.          azelastine 0.1 % nasal spray Commonly known as: ASTELIN 1-2 sorays per nostril bid as needed   EPINEPHrine 0.3 mg/0.3 mL Soaj injection Commonly known as: EPI-PEN Inject 0.3 mg into the muscle as needed for anaphylaxis.   famotidine 20 MG tablet Commonly known as: PEPCID Take 1 tablet (20 mg total) by mouth 2 (two) times daily.   fluticasone 50 MCG/ACT nasal spray Commonly known as: FLONASE Place 2 sprays into both nostrils daily.   levocetirizine 5 MG tablet Commonly known as: XYZAL Take 2 tablets twice daily for hives   omalizumab 150 MG/ML prefilled syringe Commonly known as: Xolair Inject 300 mg into the skin every 14 (fourteen) days.       Past Medical History:  Diagnosis Date   Atopic eczema 12/10/2019   Urticaria    Past Surgical History:  Procedure Laterality Date   CESAREAN SECTION     NOSE SURGERY     Otherwise, there have been no changes to her past medical history, surgical history, family history, or social history.  ROS: All others negative  except as noted per HPI.   Objective:  There were no vitals taken for this visit. There is no height or weight on file to calculate BMI. Physical Exam: General Appearance:  Alert, cooperative, no distress, appears stated age  Head:  Normocephalic, without obvious abnormality, atraumatic  Eyes:  Conjunctiva clear, EOM's intact  Nose: Nares normal, {Blank multiple:19196:a:"***","hypertrophic turbinates","normal mucosa","no visible anterior polyps","septum midline"}  Throat: Lips, tongue normal; teeth and gums normal, {Blank multiple:19196:a:"***","normal posterior oropharynx","tonsils 2+","tonsils 3+","no tonsillar exudate","+ cobblestoning"}  Neck: Supple, symmetrical  Lungs:   {Blank multiple:19196:a:"***","clear to auscultation bilaterally","end-expiratory wheezing","wheezing throughout"}, Respirations unlabored, {Blank multiple:19196:a:"***","no coughing","intermittent dry coughing"}  Heart:  {Blank multiple:19196:a:"***","regular rate and rhythm","no murmur"}, Appears well perfused  Extremities: No edema  Skin: Skin color, texture, turgor normal, no rashes or lesions on visualized portions of skin  Neurologic: No gross deficits   Reviewed: ***  Spirometry:  Tracings reviewed. Her effort: {Blank single:19197::"Good reproducible efforts.","It was hard to get consistent efforts and there is a question as to whether this reflects a maximal maneuver.","Poor effort, data can not be interpreted.","Variable effort-results affected.","decent for first attempt at spirometry."} FVC: ***L FEV1: ***L, ***% predicted FEV1/FVC ratio: ***% Interpretation: {Blank single:19197::"Spirometry consistent with mild obstructive disease","Spirometry consistent with moderate obstructive disease","Spirometry consistent with severe obstructive disease","Spirometry consistent with possible restrictive disease","Spirometry consistent with mixed obstructive and restrictive disease","Spirometry uninterpretable due to  technique","Spirometry consistent with normal pattern","No overt abnormalities noted given today's efforts"}.  Please see scanned spirometry results for details.  Skin Testing: {Blank single:19197::"Select foods","Environmental allergy panel","Environmental allergy panel and select foods","Food allergy panel","None","Deferred due to recent antihistamines use"}. Positive test to: ***. Negative test to: ***.  Results discussed with patient/family.   {Blank single:19197::"Allergy testing results were read and interpreted by myself, documented by clinical staff."," "}  Assessment/Plan   ***  Sigurd Sos, MD  [facility]

## 2021-06-13 ENCOUNTER — Ambulatory Visit: Payer: Medicaid Other | Admitting: Internal Medicine

## 2021-08-03 ENCOUNTER — Ambulatory Visit (INDEPENDENT_AMBULATORY_CARE_PROVIDER_SITE_OTHER): Payer: Medicaid Other

## 2021-08-03 DIAGNOSIS — L501 Idiopathic urticaria: Secondary | ICD-10-CM | POA: Diagnosis not present

## 2021-08-04 NOTE — Progress Notes (Signed)
FOLLOW UP Date of Service/Encounter:  08/05/21   Subjective:  Tanya Manning (DOB: 1985-05-21) is a 36 y.o. female who returns to the [facility] on 08/05/2021 in re-evaluation of the following: Chronic hives and atopic dermatitis History obtained from: chart review and patient.  For Review, LV was on 12/13/20  with Dr.Park Beck seen for routine follow-up.  Her hives were controlled at that time on Xolair injections every 2 weeks.  Her last injection was 08/03/2021.  Prior to that injection she had not had an injection since March 2023.  Today presents for follow-up. She went to a funeral in [place] for her grandmother and her eczema flared on her face and arms. Now only on her arms. She also had a few hives.  She noticed the hives more when staying at her brother's house who had air conditioning, but she also missed one of her Xolair doses while traveling. She also notes that she is starting to develop vitiligo on her hands.  Her mother has vitiligo.  She believes it developed a few years ago prior to starting school when she noticed 2 white dots on each hand bilaterally and symmetrically.  However, after finishing school this past semester she has noticed that this has spread significantly and involving her first and second fingers on both hands.  She also has a spot on her right inner upper arm that she believes may also be vitiligo.  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  Her insurance will be changing over the summer to med cost.  Previous diagnostics:  Hives started at age 15 years old. Unable to tolerate montelukast due to insomnia. Xolair dosing increased to 300 mg every 2 weeks in June 2022   CU index > 50 C4 normal, tryptase normal, thyro peroxidase, thyroglobulin antibody normal IgG H. Pylori positive with negative IgA and IgM Alpha gal negative, ANA  negative, ESR 8   Allergies as of 08/05/2021   No Known Allergies      Medication List        Accurate as of August 05, 2021 12:25 PM. If you have any questions, ask your nurse or doctor.          azelastine 0.1 % nasal spray Commonly known as: ASTELIN 1-2 sorays per nostril bid as needed   EPINEPHrine 0.3 mg/0.3 mL Soaj injection Commonly known as: EPI-PEN Inject 0.3 mg into the muscle as needed for anaphylaxis.   famotidine 20 MG tablet Commonly known as: PEPCID Take 1 tablet (20 mg total) by mouth 2 (two) times daily.   fluticasone 50 MCG/ACT nasal spray Commonly known as: FLONASE Place 2 sprays into both nostrils daily.   levocetirizine 5 MG tablet Commonly known as: XYZAL Take 2 tablets twice daily for hives   omalizumab 150 MG/ML prefilled syringe Commonly known as: Xolair Inject 300 mg into the skin every 14 (fourteen) days.       Past Medical History:  Diagnosis Date   Atopic eczema 12/10/2019   Urticaria    Past Surgical History:  Procedure Laterality Date   CESAREAN SECTION     NOSE SURGERY     Otherwise, there have been no changes to her past medical history, surgical history, family history, or social history.  ROS: All others negative except as noted per HPI.   Objective:  BP 114/78   Pulse 92  Temp 98.1 F (36.7 C)   Resp 18   Wt 203 lb 14.4 oz (92.5 kg)   SpO2 98%   BMI 33.41 kg/m  Body mass index is 33.41 kg/m. Physical Exam: General Appearance:  Alert, cooperative, no distress, appears stated age  Head:  Normocephalic, without obvious abnormality, atraumatic  Eyes:  Conjunctiva clear, EOM's intact  Nose: Nares normal, hypertrophic turbinates, normal mucosa, and no visible anterior polyps  Throat: Lips, tongue normal; teeth and gums normal, normal posterior oropharynx  Neck: Supple, symmetrical  Lungs:   clear to auscultation bilaterally, Respirations unlabored, no coughing  Heart:  regular rate and rhythm and no murmur,  Appears well perfused  Extremities: No edema  Skin: Skin color, texture, turgor normal, depigmented skin between the first and second fingers on bilateral hands, hypopigmented area on right upper arm, dry erythematous patch on left antecubital fossa, edematous raised erythematous area on left foot  Neurologic: No gross deficits   Assessment/Plan  Kelseigh continues to have flares of her hives which are infrequent but still occurring despite Xolair 300 mg twice a day.  Additionally she has a flare of her eczema on her left antecubital fossa.  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 If no symptoms for 7-14 days then decrease to. Levocetirizine (Xyzal) 5 mg twice a day and famotidine (Pepcid) 20 mg once a day.  If no symptoms for 7-14 days then decrease to. Levocetirizine (Xyzal) 5 mg twice a day.  If no symptoms for 7-14 days then decrease to. Levocetirizine (Xyzal) 5 mg once a day.  Continue Xolair injections 300 mg once every 2 weeks.    Atopic Dermatitis- current flare on arm:  Daily Care For Maintenance (daily and continue even once eczema controlled) - Use hypoallergenic hydrating ointment at least twice daily.  This must be done daily for control of flares. (Great options include Vaseline, CeraVe, Aquaphor, Aveeno, Cetaphil, VaniCream, etc) - Avoid detergents, soaps or lotions with fragrances/dyes - Limit showers/baths to 5 minutes and use luke warm water instead of hot, pat dry following baths, and apply moisturizer - can use steroid/non-steroid therapy creams as detailed below up to twice weekly for prevention of flares.  For Flares:(add this to maintenance therapy if needed for flares) First apply steroid/non-steroid treatment  creams. Wait 5 minutes then apply moisturizer.  - Clobetasol 0.05% to body for severe flares-apply topically twice daily to red, raised, thickened areas of skin, followed by moisturizer  - Triamcinolone 0.1% to body for moderate flares-apply topically twice daily to red, raised areas of skin, followed by moisturizer - will look into Opzelura as an option, will call once I have spoke with our representative about this option for you  Concern for nonsegmental vitiligo: -Referral to [facility] dermatology  Call the clinic if this treatment plan is not working well for you.  Follow up in 6 months or sooner if needed. It was wonderful seeing you again in clinic today.   Sigurd Sos, MD  [facility]

## 2021-08-05 ENCOUNTER — Encounter: Payer: Self-pay | Admitting: Internal Medicine

## 2021-08-05 ENCOUNTER — Ambulatory Visit (INDEPENDENT_AMBULATORY_CARE_PROVIDER_SITE_OTHER): Payer: Medicaid Other | Admitting: Internal Medicine

## 2021-08-05 VITALS — BP 114/78 | HR 92 | Temp 98.1°F | Resp 18 | Wt 203.9 lb

## 2021-08-05 DIAGNOSIS — L508 Other urticaria: Secondary | ICD-10-CM

## 2021-08-05 DIAGNOSIS — L80 Vitiligo: Secondary | ICD-10-CM | POA: Diagnosis not present

## 2021-08-05 DIAGNOSIS — L2089 Other atopic dermatitis: Secondary | ICD-10-CM

## 2021-08-05 MED ORDER — TRIAMCINOLONE ACETONIDE 0.1 % EX OINT: TOPICAL_OINTMENT | CUTANEOUS | 1 refills | Status: AC

## 2021-08-05 MED ORDER — CLOBETASOL PROPIONATE 0.05 % EX OINT: TOPICAL_OINTMENT | CUTANEOUS | 1 refills | Status: AC

## 2021-08-05 MED ORDER — EPINEPHRINE 0.3 MG/0.3ML IJ SOAJ: 0.3000 mg | INTRAMUSCULAR | 1 refills | Status: DC | PRN

## 2021-08-05 MED ORDER — LEVOCETIRIZINE DIHYDROCHLORIDE 5 MG PO TABS: ORAL_TABLET | ORAL | 5 refills | Status: DC

## 2021-08-05 NOTE — Patient Instructions (Addendum)
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 If no symptoms for 7-14 days then decrease to. Levocetirizine (Xyzal) 5 mg twice a day and famotidine (Pepcid) 20 mg once a day.  If no symptoms for 7-14 days then decrease to. Levocetirizine (Xyzal) 5 mg twice a day.  If no symptoms for 7-14 days then decrease to. Levocetirizine (Xyzal) 5 mg once a day.  Continue Xolair injections 300 mg once every 2 weeks.    Atopic Dermatitis- current flare on arm:  Daily Care For Maintenance (daily and continue even once eczema controlled) - Use hypoallergenic hydrating ointment at least twice daily.  This must be done daily for control of flares. (Great options include Vaseline, CeraVe, Aquaphor, Aveeno, Cetaphil, VaniCream, etc) - Avoid detergents, soaps or lotions with fragrances/dyes - Limit showers/baths to 5 minutes and use luke warm water instead of hot, pat dry following baths, and apply moisturizer - can use steroid/non-steroid therapy creams as detailed below up to twice weekly for prevention of flares.  For Flares:(add this to maintenance therapy if needed for flares) First apply steroid/non-steroid treatment creams. Wait 5 minutes then apply moisturizer.  - Clobetasol 0.05% to body for severe flares-apply topically twice daily to red, raised, thickened areas of skin, followed by moisturizer  - Triamcinolone 0.1% to body for moderate flares-apply topically twice daily to red, raised areas of skin, followed by moisturizer - will look into Opzelura as an option, will call once I have spoke with our representative about this option for you  Concern for nonsegmental vitiligo: -Referral to [facility] dermatology   Call the clinic if this treatment plan is not working well for you.  Follow up in 6 months or sooner if needed. It was  wonderful seeing you again in clinic today.

## 2021-08-11 ENCOUNTER — Telehealth: Payer: Self-pay

## 2021-08-11 ENCOUNTER — Other Ambulatory Visit: Payer: Self-pay

## 2021-08-11 MED ORDER — EPIPEN 2-PAK 0.3 MG/0.3ML IJ SOAJ: 0.3000 mg | INTRAMUSCULAR | 1 refills | Status: DC | PRN

## 2021-08-11 NOTE — Telephone Encounter (Signed)
Patient has been notified that name brand epi pen is covered. The pharmacy is ordering it today and it should be ready for pickup by Monday 08/15/21.

## 2021-08-15 IMAGING — CR DG CHEST 2V
2 series · 2 of 2 positions shown · non-contrast
Comparison: None.

CLINICAL DATA: Shortness of breath for 1 month.

EXAM:
CHEST - 2 VIEW

[w chest pa]
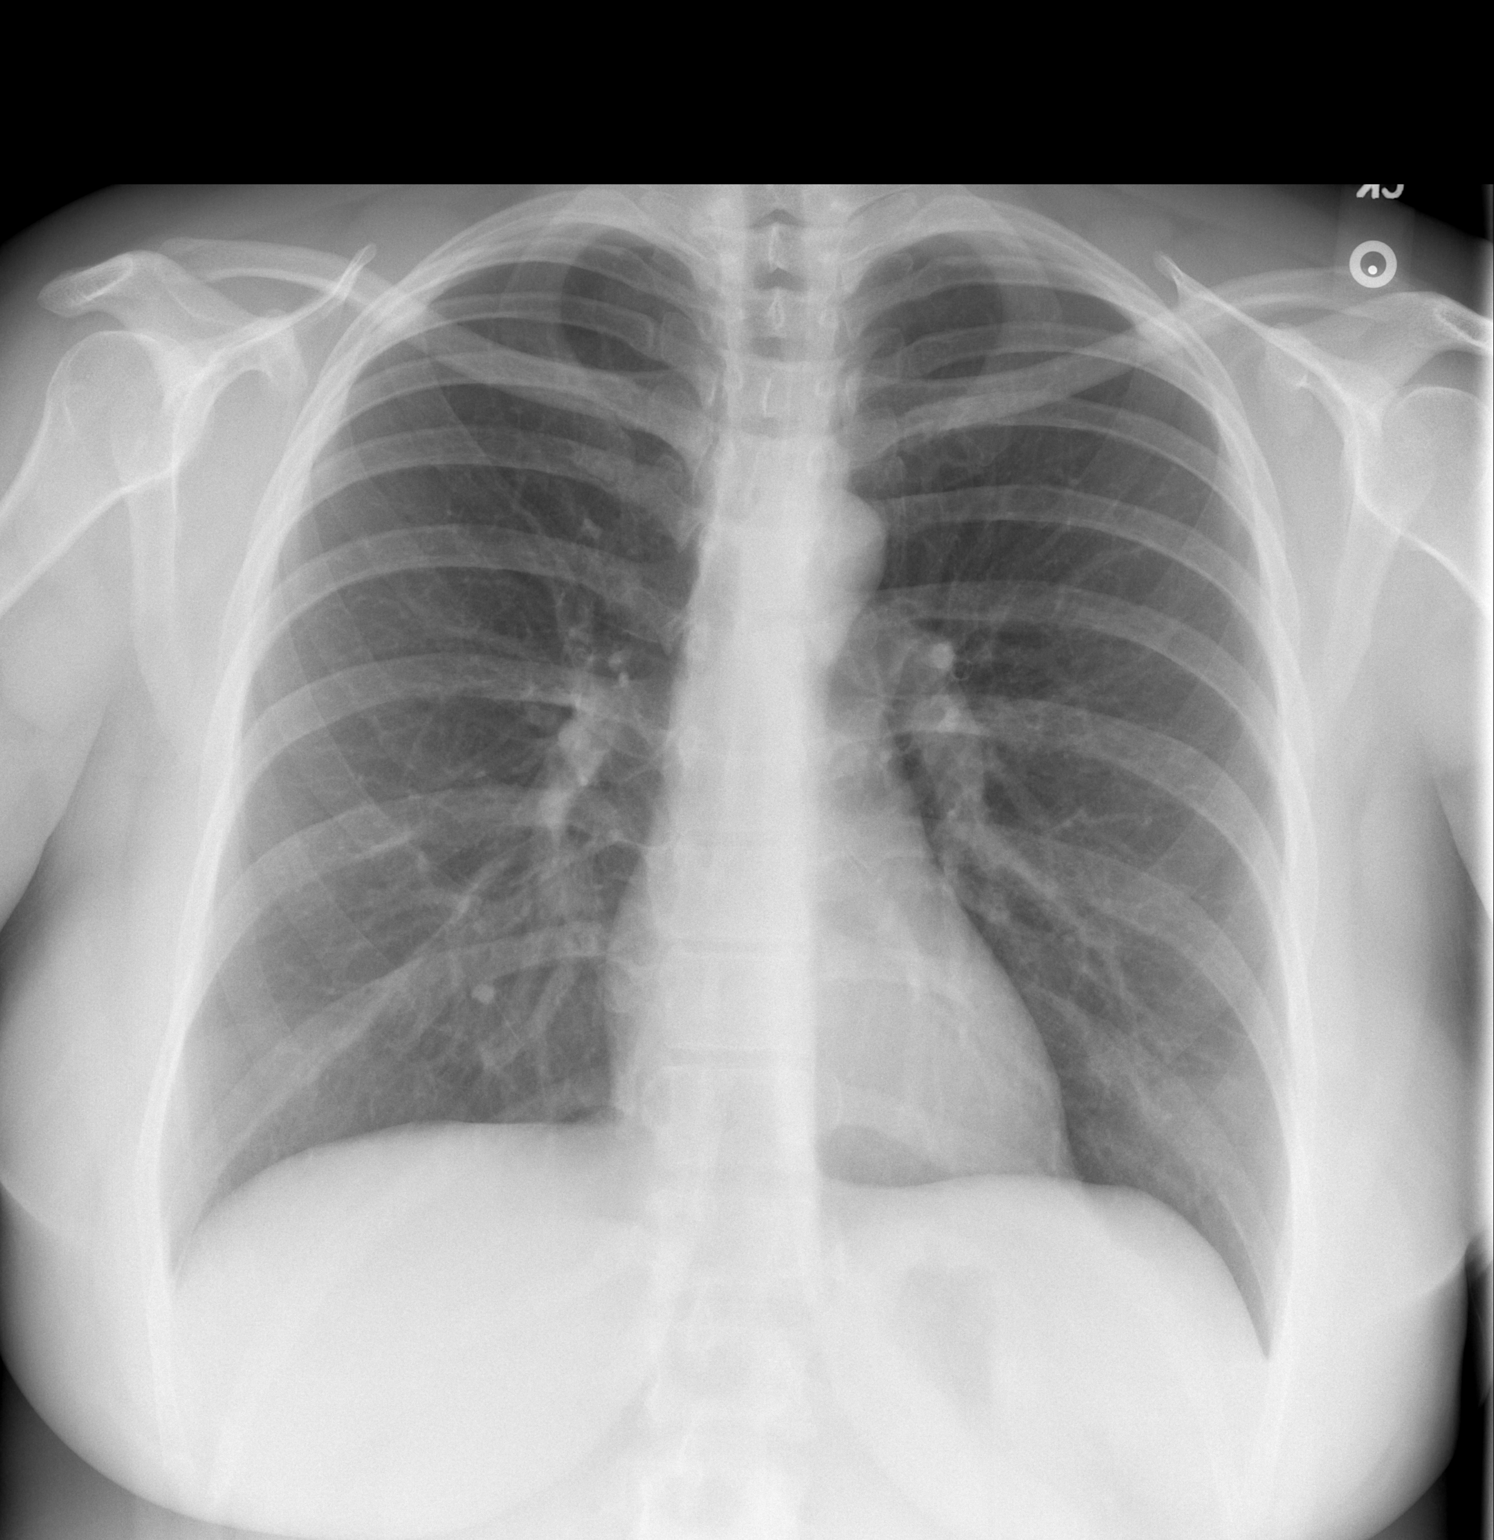

[w chest lat]
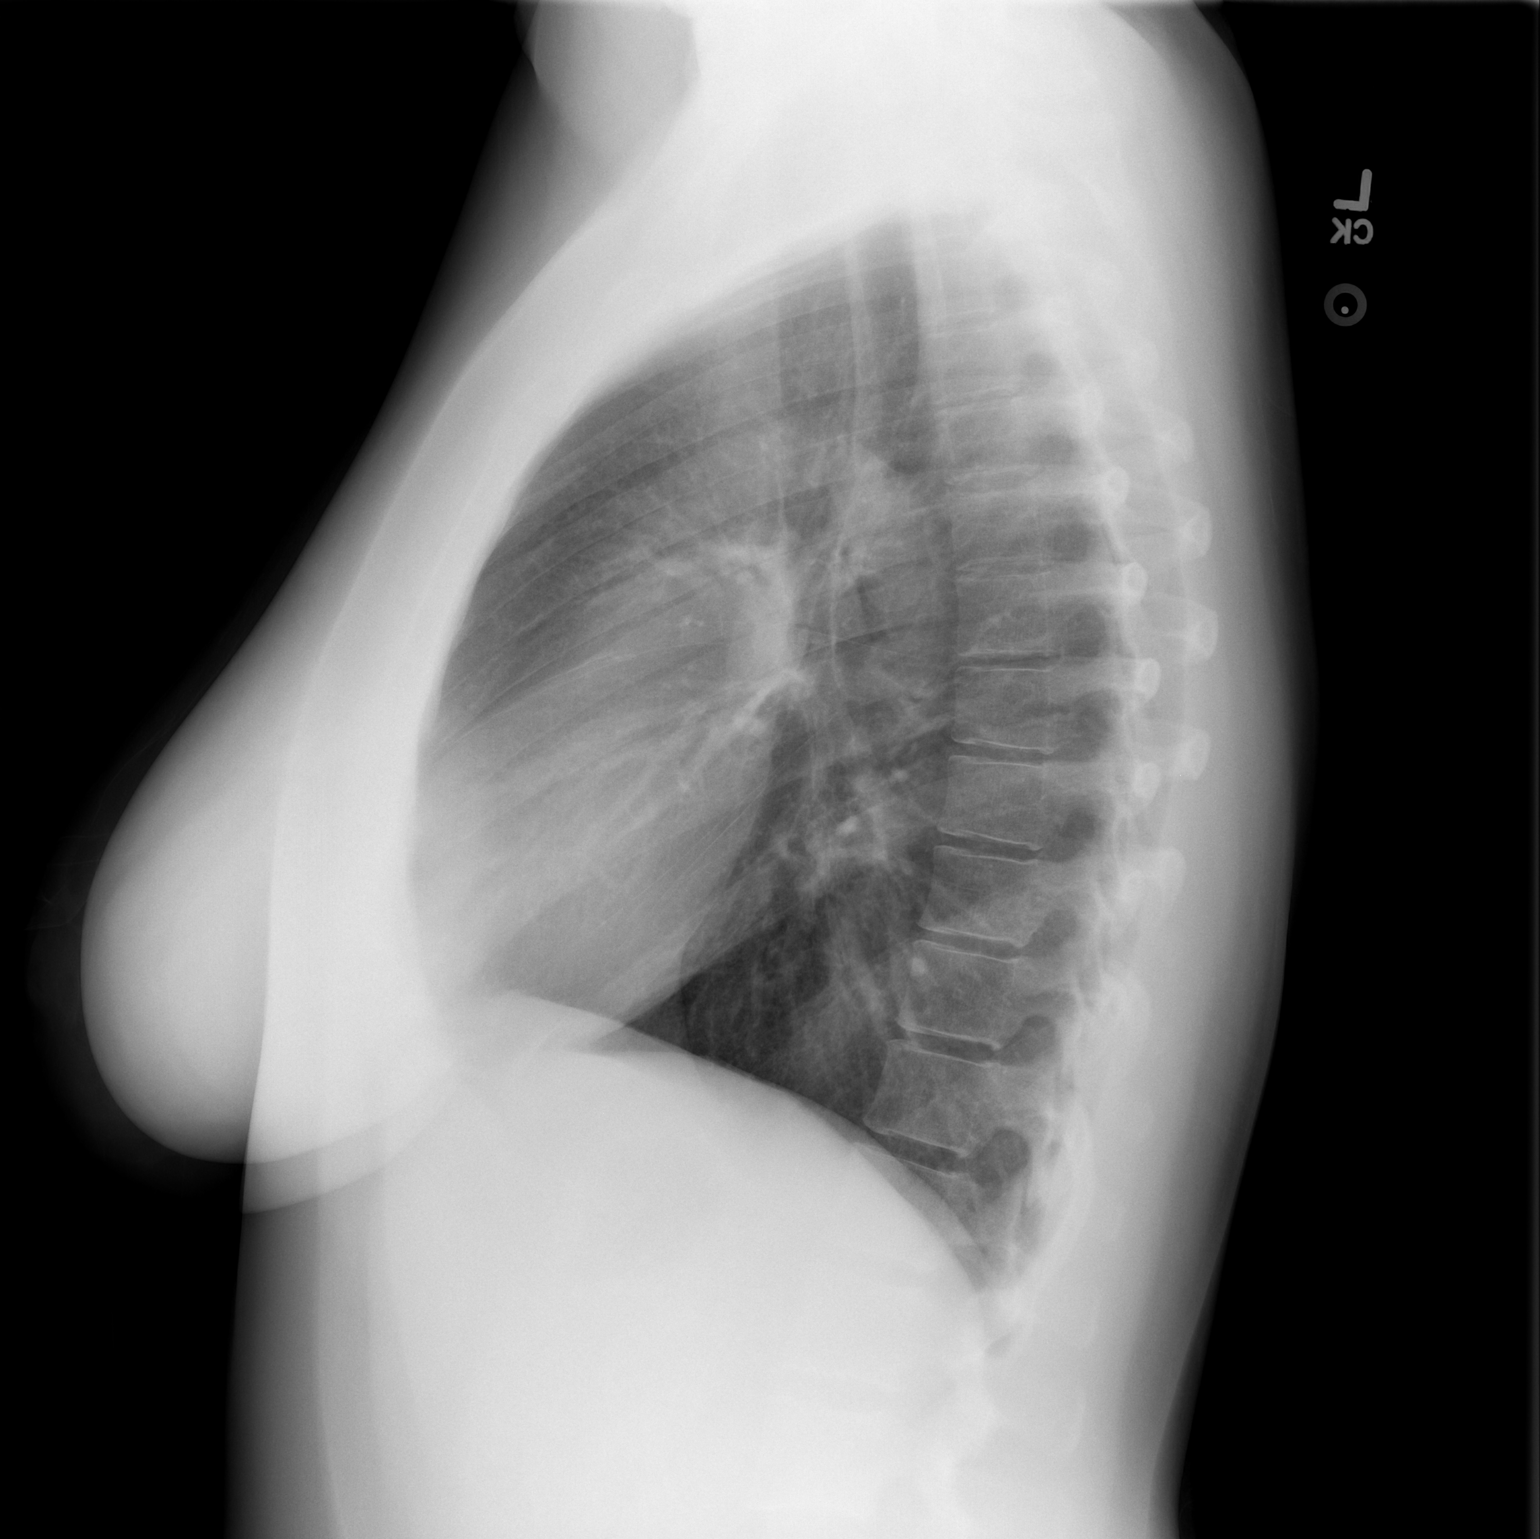

[2 of 2 positions shown; findings below may reference images not displayed]

FINDINGS: The heart size and mediastinal contours are within normal limits.
Both lungs are clear. The visualized skeletal structures are
unremarkable.
IMPRESSION: Normal study.

## 2021-08-15 NOTE — Telephone Encounter (Signed)
PA was required..  KEYBoneta Lucks created: 08/05/21  PA Case ID: [id number] approved: 08/15/21  PA Case: [id number], Status: Approved, Coverage Starts on: 08/15/2021 12:00:00 AM, Coverage Ends on: 08/15/2022 12:00:00 AM.

## 2021-08-16 ENCOUNTER — Telehealth: Payer: Self-pay

## 2021-08-16 NOTE — Telephone Encounter (Signed)
Patients referral has been placed/faxed to [facility] - [facility]  MyChart message sent to patient.

## 2021-08-16 NOTE — Telephone Encounter (Signed)
-----   Message from Verlee Monte, MD sent at 08/05/2021 12:31 PM EDT ----- Can we get her referred to [facility] dermatology?  Her insurance will be changing to med cost soon.  Referral is for vitiligo.  Thanks

## 2021-08-22 ENCOUNTER — Ambulatory Visit (INDEPENDENT_AMBULATORY_CARE_PROVIDER_SITE_OTHER): Payer: Medicaid Other

## 2021-08-22 DIAGNOSIS — L501 Idiopathic urticaria: Secondary | ICD-10-CM | POA: Diagnosis not present

## 2021-08-22 NOTE — Telephone Encounter (Signed)
I am waiting to hear back from the rep.  I will hopefully get some news this week and call her.

## 2021-08-26 ENCOUNTER — Other Ambulatory Visit: Payer: Self-pay | Admitting: *Deleted

## 2021-08-26 MED ORDER — OMALIZUMAB 150 MG/ML ~~LOC~~ SOSY: 300.0000 mg | PREFILLED_SYRINGE | SUBCUTANEOUS | 11 refills | Status: AC

## 2021-09-01 ENCOUNTER — Telehealth: Payer: Self-pay | Admitting: *Deleted

## 2021-09-01 NOTE — Telephone Encounter (Signed)
-----   Message from Verlee Monte, MD sent at 08/31/2021  5:50 PM EDT ----- Can we call Tanya Manning and let her know that she can get Opzelura and that if she can come by [facility] office either tomorrow (Thursday) or Friday, then I can go over with her how to use it---this is the topical oitnment for vitiligo that we talked about!     Please let me know when she is coming in so I can talk to her in clinic! Thanks

## 2021-09-01 NOTE — Telephone Encounter (Signed)
Tried calling, left a voicemail for a return call.

## 2021-09-02 ENCOUNTER — Encounter: Payer: Self-pay | Admitting: *Deleted

## 2021-09-02 NOTE — Telephone Encounter (Signed)
Not sure-I did try calling her but went straight to voicemail.  I want her to come get the cream for her hands for the vitiligo until she is able to establish with Dermatology.  I'm not sure what her question is.

## 2021-09-02 NOTE — Telephone Encounter (Signed)
Pt called about receiveing a missed call. Also had a question because she was confused on what her and Dr Lemar Livings discussed on last visit.

## 2021-09-02 NOTE — Telephone Encounter (Signed)
Sent pt a MyChart message

## 2021-09-02 NOTE — Telephone Encounter (Signed)
Do you know what she is referring to? 

## 2021-09-05 ENCOUNTER — Ambulatory Visit: Payer: Medicaid Other

## 2021-09-13 ENCOUNTER — Ambulatory Visit: Payer: Medicaid Other

## 2021-09-20 ENCOUNTER — Ambulatory Visit: Payer: Medicaid Other

## 2021-10-07 ENCOUNTER — Ambulatory Visit (INDEPENDENT_AMBULATORY_CARE_PROVIDER_SITE_OTHER): Payer: Medicaid Other

## 2021-10-07 DIAGNOSIS — L501 Idiopathic urticaria: Secondary | ICD-10-CM

## 2021-10-19 ENCOUNTER — Ambulatory Visit (INDEPENDENT_AMBULATORY_CARE_PROVIDER_SITE_OTHER): Payer: Medicaid Other

## 2021-10-19 DIAGNOSIS — L501 Idiopathic urticaria: Secondary | ICD-10-CM | POA: Diagnosis not present

## 2021-10-21 ENCOUNTER — Ambulatory Visit: Payer: Medicaid Other

## 2021-11-04 ENCOUNTER — Ambulatory Visit (INDEPENDENT_AMBULATORY_CARE_PROVIDER_SITE_OTHER): Payer: Medicaid Other

## 2021-11-04 DIAGNOSIS — L501 Idiopathic urticaria: Secondary | ICD-10-CM | POA: Diagnosis not present

## 2021-11-18 ENCOUNTER — Ambulatory Visit (INDEPENDENT_AMBULATORY_CARE_PROVIDER_SITE_OTHER): Payer: Medicaid Other

## 2021-11-18 DIAGNOSIS — L501 Idiopathic urticaria: Secondary | ICD-10-CM | POA: Diagnosis not present

## 2021-11-30 ENCOUNTER — Ambulatory Visit (INDEPENDENT_AMBULATORY_CARE_PROVIDER_SITE_OTHER): Payer: Medicaid Other

## 2021-11-30 DIAGNOSIS — L501 Idiopathic urticaria: Secondary | ICD-10-CM | POA: Diagnosis not present

## 2021-12-14 ENCOUNTER — Ambulatory Visit (INDEPENDENT_AMBULATORY_CARE_PROVIDER_SITE_OTHER): Payer: Medicaid Other

## 2021-12-14 DIAGNOSIS — L501 Idiopathic urticaria: Secondary | ICD-10-CM | POA: Diagnosis not present

## 2021-12-14 DIAGNOSIS — J309 Allergic rhinitis, unspecified: Secondary | ICD-10-CM

## 2022-01-04 ENCOUNTER — Ambulatory Visit (INDEPENDENT_AMBULATORY_CARE_PROVIDER_SITE_OTHER): Payer: Medicaid Other

## 2022-01-04 DIAGNOSIS — L501 Idiopathic urticaria: Secondary | ICD-10-CM

## 2022-01-18 ENCOUNTER — Ambulatory Visit (INDEPENDENT_AMBULATORY_CARE_PROVIDER_SITE_OTHER): Payer: Medicaid Other

## 2022-01-18 DIAGNOSIS — L501 Idiopathic urticaria: Secondary | ICD-10-CM | POA: Diagnosis not present

## 2022-03-13 ENCOUNTER — Other Ambulatory Visit: Payer: Self-pay | Admitting: Internal Medicine

## 2022-03-14 NOTE — Telephone Encounter (Signed)
Levocetirizine 5 mg and famotidine 20 mg sent to [facility] x 1 with 1 refill

## 2022-05-28 ENCOUNTER — Other Ambulatory Visit: Payer: Self-pay

## 2022-05-28 ENCOUNTER — Emergency Department (HOSPITAL_BASED_OUTPATIENT_CLINIC_OR_DEPARTMENT_OTHER)
Admission: EM | Admit: 2022-05-28 | Discharge: 2022-05-28 | Disposition: A | Discharge status: Home / Self Care | Payer: PRIVATE HEALTH INSURANCE | Attending: Emergency Medicine | Admitting: Emergency Medicine

## 2022-05-28 DIAGNOSIS — T7840XA Allergy, unspecified, initial encounter: Secondary | ICD-10-CM

## 2022-05-28 LAB — PREGNANCY, URINE: Preg Test, Ur: NEGATIVE

## 2022-05-28 MED ORDER — FAMOTIDINE IN NACL 20-0.9 MG/50ML-% IV SOLN
20.0000 mg | Freq: Once | INTRAVENOUS | Status: AC
  Administered 2022-05-28: 20 mg via INTRAVENOUS
  Filled 2022-05-28: qty 50

## 2022-05-28 MED ORDER — PREDNISONE 20 MG PO TABS: 40.0000 mg | ORAL_TABLET | Freq: Every day | ORAL | 0 refills | Status: AC

## 2022-05-28 MED ORDER — EPIPEN 2-PAK 0.3 MG/0.3ML IJ SOAJ: 0.3000 mg | INTRAMUSCULAR | 1 refills | Status: AC | PRN

## 2022-05-28 MED ORDER — METHYLPREDNISOLONE SODIUM SUCC 125 MG IJ SOLR
125.0000 mg | Freq: Once | INTRAMUSCULAR | Status: AC
  Administered 2022-05-28: 125 mg via INTRAVENOUS
  Filled 2022-05-28: qty 2

## 2022-05-28 MED ORDER — SODIUM CHLORIDE 0.9 % IV SOLN: INTRAVENOUS | Status: DC

## 2022-05-28 MED ORDER — DIPHENHYDRAMINE HCL 50 MG/ML IJ SOLN
25.0000 mg | Freq: Once | INTRAMUSCULAR | Status: AC
  Administered 2022-05-28: 25 mg via INTRAVENOUS
  Filled 2022-05-28: qty 1

## 2022-05-28 NOTE — ED Provider Notes (Signed)
[place] [facility] Provider Note   CSN: [id number] Arrival date & time: 05/28/22  1833     History  Chief Complaint  Patient presents with   Allergic Reaction    Tanya Manning is a 37 y.o. female.  With a history of autoimmune urticaria who presents to the ED for evaluation of an allergic reaction.  She states she was eating a [employer] peanut butter cup when she developed coughing.  Shortly after this she developed wheezing, difficulty breathing and started to feel like her throat was closing up.  She then had an episode of emesis.  This occurred approximately 30 minutes prior to arrival.  She self administered her EpiPen, but states she does not know if she got the full dose as she reactively pulled the needle out prior to 10 seconds.  She does know that she got some of the dose as she has developed a headache and her symptoms have significantly improved.  She does not have any shortness of breath or feeling like her throat is closing up, however does still have an itchy sensation in her throat.  She denies chest pain or rash.   Allergic Reaction Presenting symptoms: wheezing        Home Medications Prior to Admission medications   Medication Sig Start Date End Date Taking? Authorizing Provider  predniSONE (DELTASONE) 20 MG tablet Take 2 tablets (40 mg total) by mouth daily for 4 days. 05/28/22 06/01/22 Yes Braidyn Scorsone, Edsel Petrin, PA-C  azelastine (ASTELIN) 0.1 % nasal spray 1-2 sorays per nostril bid as needed Patient not taking: Reported on 08/05/2021 09/29/19   Bobbitt, Heywood Iles, MD  clobetasol ointment (TEMOVATE) 0.05 % Apply topically twice daily to BODY as needed for SEVERE red, sandpaper and thickened like rash.  Do not use on face, groin or armpits.  Use for up to two weeks at a time. 08/05/21   Verlee Monte, MD  EPIPEN 2-PAK 0.3 MG/0.3ML SOAJ injection Inject 0.3 mg into the muscle as needed for anaphylaxis. 05/28/22   Sherryann Frese,  Edsel Petrin, PA-C  famotidine (PEPCID) 20 MG tablet Take 1 tablet by mouth twice daily 03/14/22   Verlee Monte, MD  fluticasone [facility]) 50 MCG/ACT nasal spray Place 2 sprays into both nostrils daily. Patient not taking: Reported on 08/05/2021 10/03/19   Nehemiah Settle, FNP  levocetirizine (XYZAL) 5 MG tablet TAKE 2 TABLETS BY MOUTH TWICE DAILY AS NEEDED FOR HIVES 03/14/22   Verlee Monte, MD  omalizumab [facility]) 150 MG/ML prefilled syringe Inject 300 mg into the skin every 14 (fourteen) days. 08/26/21   Verlee Monte, MD  triamcinolone ointment (KENALOG) 0.1 % Apply topically twice daily to BODY as needed for red, sandpaper like rash.  Do not use on face, groin or armpits. 08/05/21   Verlee Monte, MD      Allergies    Patient has no known allergies.    Review of Systems   Review of Systems  Respiratory:  Positive for shortness of breath and wheezing.   All other systems reviewed and are negative.   Physical Exam Updated Vital Signs BP 107/72   Pulse 92   Temp 98.2 F (36.8 C) (Oral)   Resp (!) 21   Ht  (1.676 m)   Wt 86.2 kg   SpO2 100%   BMI 30.67 kg/m  Physical Exam Vitals and nursing note reviewed.  Constitutional:      General: She is not in acute  distress.    Appearance: She is well-developed. She is not ill-appearing, toxic-appearing or diaphoretic.  HENT:     Head: Normocephalic and atraumatic.  Eyes:     Conjunctiva/sclera: Conjunctivae normal.  Cardiovascular:     Rate and Rhythm: Normal rate and regular rhythm.     Heart sounds: No murmur heard. Pulmonary:     Effort: Pulmonary effort is normal. No respiratory distress.     Breath sounds: Normal breath sounds. No stridor. No wheezing, rhonchi or rales.  Abdominal:     Palpations: Abdomen is soft.     Tenderness: There is no abdominal tenderness. There is no guarding.  Musculoskeletal:        General: No swelling.     Cervical back: Neck supple.  Skin:    General: Skin is warm and dry.     Capillary  Refill: Capillary refill takes less than 2 seconds.  Neurological:     Mental Status: She is alert.  Psychiatric:        Mood and Affect: Mood normal.     ED Results / Procedures / Treatments   Labs (all labs ordered are listed, but only abnormal results are displayed) Labs Reviewed  PREGNANCY, URINE    EKG None  Radiology No results found.  Procedures Procedures    Medications Ordered in ED Medications  0.9 %  sodium chloride infusion ( Intravenous New Bag/Given 05/28/22 1900)  diphenhydrAMINE (BENADRYL) injection 25 mg (25 mg Intravenous Given 05/28/22 1904)  methylPREDNISolone sodium succinate (SOLU-MEDROL) 125 mg/2 mL injection 125 mg (125 mg Intravenous Given 05/28/22 1903)  famotidine (PEPCID) IVPB 20 mg premix (0 mg Intravenous Stopped 05/28/22 1935)    ED Course/ Medical Decision Making/ A&P                             Medical Decision Making Amount and/or Complexity of Data Reviewed Labs: ordered.  Risk Prescription drug management.  This patient presents to the ED for concern of allergic reaction, this involves an extensive number of treatment options, and is a complaint that carries with it a high risk of complications and morbidity.  The differential diagnosis includes urticaria, anaphylaxis, other allergic reaction  Co morbidities that complicate the patient evaluation   autoimmune angioedema  My initial workup includes allergic reaction medications  Additional history obtained from: Nursing notes from this visit.  Cardiac Monitoring:  The patient was maintained on a cardiac monitor.  I personally viewed and interpreted the cardiac monitored which showed an underlying rhythm of: NSR  Afebrile, hemodynamically stable.  37 year old female presents ED for evaluation of a possible allergic reaction.  Has a history of such.  Was eating a [employer] peanut butter cup prior to this.  Does not have a specific allergy to peanuts.  She used her EpiPen  approximately 30 minutes prior to arrival with significant improvement in her symptoms.  Was given medications here to help with her symptoms.  She reported significant improvement and was feeling back to baseline prior to discharge.  She is out of her EpiPen's and was sent another prescription for this.  Also sent a prescription for prednisone burst pack.  She was encouraged to follow-up with her primary care provider in 1 week for reevaluation.  She was monitored in the ED for 3 hours.  She was given strict return precautions including signs and symptoms of anaphylaxis.  Stable at discharge.  At this time there does not appear to  be any evidence of an acute emergency medical condition and the patient appears stable for discharge with appropriate outpatient follow up. Diagnosis was discussed with patient who verbalizes understanding of care plan and is agreeable to discharge. I have discussed return precautions with patient who verbalizes understanding. Patient encouraged to follow-up with their PCP within 1 week. All questions answered.  Patient's case discussed with Dr. Elpidio Anis who agrees with plan to discharge with follow-up.   Note: Portions of this report may have been transcribed using voice recognition software. Every effort was made to ensure accuracy; however, inadvertent computerized transcription errors may still be present.        Final Clinical Impression(s) / ED Diagnoses Final diagnoses:  Allergic reaction, initial encounter    Rx / DC Orders ED Discharge Orders          Ordered    predniSONE (DELTASONE) 20 MG tablet  Daily        05/28/22 2129    EPIPEN 2-PAK 0.3 MG/0.3ML SOAJ injection  As needed        05/28/22 2129              Mora Bellman 05/28/22 2133    Mardene Sayer, MD 05/29/22 [id number]

## 2022-05-28 NOTE — Discharge Instructions (Signed)
You have been seen today for your complaint of allergic reaction. Your discharge medications include refill of your EpiPen's. Prednisone.  This is a steroid.  Take it as prescribed and for the entire duration of the prescription.  It may cause jitteriness.  Take it in the morning with breakfast. Follow up with: Your primary care provider in 1 week for reevaluation Please seek immediate medical care if you develop any of the following symptoms: You have symptoms of an allergic reaction. You use an auto-injector pen. You will need more medical care even if the medicine seems to be working. An anaphylactic reaction may happen again within 72 hours (rebound anaphylaxis). At this time there does not appear to be the presence of an emergent medical condition, however there is always the potential for conditions to change. Please read and follow the below instructions.  Do not take your medicine if  develop an itchy rash, swelling in your mouth or lips, or difficulty breathing; call 911 and seek immediate emergency medical attention if this occurs.  You may review your lab tests and imaging results in their entirety on your MyChart account.  Please discuss all results of fully with your primary care provider and other specialist at your follow-up visit.  Note: Portions of this text may have been transcribed using voice recognition software. Every effort was made to ensure accuracy; however, inadvertent computerized transcription errors may still be present.

## 2022-05-28 NOTE — ED Triage Notes (Addendum)
Patient presents to ED via POV from home. Here after eating a Reese cup. Patient has a history of mast cell autoimmune allergic reactions. Patient carries an epi pen. Self administered PTA. Audible wheezes and cough noted. Skin is flushed. Endorses "tingling throat". Patient reports improvement in symptoms. Prior to epi patient states "It was like I couldn't get air in".

## 2022-07-03 ENCOUNTER — Other Ambulatory Visit: Payer: Self-pay | Admitting: Internal Medicine

## 2022-07-18 ENCOUNTER — Other Ambulatory Visit (HOSPITAL_COMMUNITY): Payer: Self-pay

## 2022-07-18 ENCOUNTER — Telehealth: Payer: Self-pay

## 2022-07-18 MED ORDER — EPIPEN 2-PAK 0.3 MG/0.3ML IJ SOAJ: 0.3000 mg | INTRAMUSCULAR | 1 refills | Status: AC | PRN

## 2022-07-18 NOTE — Telephone Encounter (Signed)
Sent in brand epipen to pts pharmacy

## 2022-07-18 NOTE — Telephone Encounter (Signed)
Patient Advocate Encounter   Received notification from [facility] [place] that prior authorization is required for EPINEPHrine 0.3MG /0.3ML auto-injectors   Submitted: n/a Key [id number]  PA not submitted at this time. Processes fine if run through for brand name Epi-pen

## 2022-08-08 ENCOUNTER — Other Ambulatory Visit: Payer: Self-pay | Admitting: Internal Medicine

## 2022-08-10 ENCOUNTER — Other Ambulatory Visit: Payer: Self-pay | Admitting: Internal Medicine

## 2022-08-16 ENCOUNTER — Other Ambulatory Visit: Payer: Self-pay | Admitting: Internal Medicine

## 2022-08-16 NOTE — Telephone Encounter (Signed)
Needs OV.  

## 2022-11-24 ENCOUNTER — Telehealth: Payer: Self-pay | Admitting: Internal Medicine

## 2022-11-24 NOTE — Telephone Encounter (Signed)
Spoke with patient regarding her Xolair. Patient states her insurance changed to [facility] and she can only see [facility] providers. She had questions on her Xolair and if we could still order for her or if it would be better for her to see an allergist within her insurance network.

## 2022-11-24 NOTE — Telephone Encounter (Signed)
Called patient back after talking with Tammy and informed her she would be considered out of network with [place] and would be best for her to see [facility] to have covered visits/medications.

## 2023-06-10 ENCOUNTER — Other Ambulatory Visit: Payer: Self-pay | Admitting: Medical Genetics

## 2023-11-16 ENCOUNTER — Other Ambulatory Visit: Payer: Self-pay | Admitting: Medical Genetics

## 2023-11-16 DIAGNOSIS — Z006 Encounter for examination for normal comparison and control in clinical research program: Secondary | ICD-10-CM

## 2023-12-11 LAB — GENECONNECT MOLECULAR SCREEN: Genetic Analysis Overall Interpretation: NEGATIVE
